# Patient Record
Sex: Female | Born: 1965 | Race: White | Hispanic: No | Marital: Married | Smoking: Former smoker
Health system: Southern US, Community
[De-identification: ages and names within clinical notes are randomized; demographics above are authoritative.]

## PROBLEM LIST (undated history)

## (undated) DIAGNOSIS — E039 Hypothyroidism, unspecified: Secondary | ICD-10-CM

## (undated) DIAGNOSIS — Z5189 Encounter for other specified aftercare: Secondary | ICD-10-CM

## (undated) DIAGNOSIS — N200 Calculus of kidney: Secondary | ICD-10-CM

## (undated) DIAGNOSIS — F419 Anxiety disorder, unspecified: Secondary | ICD-10-CM

## (undated) DIAGNOSIS — D649 Anemia, unspecified: Secondary | ICD-10-CM

## (undated) DIAGNOSIS — E559 Vitamin D deficiency, unspecified: Secondary | ICD-10-CM

## (undated) DIAGNOSIS — Z86010 Personal history of colonic polyps: Principal | ICD-10-CM

## (undated) DIAGNOSIS — F53 Postpartum depression: Secondary | ICD-10-CM

## (undated) DIAGNOSIS — T7840XA Allergy, unspecified, initial encounter: Secondary | ICD-10-CM

## (undated) DIAGNOSIS — O99345 Other mental disorders complicating the puerperium: Secondary | ICD-10-CM

## (undated) DIAGNOSIS — E063 Autoimmune thyroiditis: Secondary | ICD-10-CM

## (undated) DIAGNOSIS — K3184 Gastroparesis: Secondary | ICD-10-CM

## (undated) DIAGNOSIS — M8580 Other specified disorders of bone density and structure, unspecified site: Secondary | ICD-10-CM

## (undated) HISTORY — DX: Hypothyroidism, unspecified: E03.9

## (undated) HISTORY — DX: Postpartum depression: F53.0

## (undated) HISTORY — DX: Other mental disorders complicating the puerperium: O99.345

## (undated) HISTORY — PX: COLONOSCOPY: SHX174

## (undated) HISTORY — DX: Gastroparesis: K31.84

## (undated) HISTORY — DX: Calculus of kidney: N20.0

## (undated) HISTORY — DX: Vitamin D deficiency, unspecified: E55.9

## (undated) HISTORY — DX: Personal history of colonic polyps: Z86.010

## (undated) HISTORY — DX: Allergy, unspecified, initial encounter: T78.40XA

## (undated) HISTORY — DX: Anxiety disorder, unspecified: F41.9

## (undated) HISTORY — DX: Other specified disorders of bone density and structure, unspecified site: M85.80

## (undated) HISTORY — DX: Encounter for other specified aftercare: Z51.89

## (undated) HISTORY — DX: Anemia, unspecified: D64.9

## (undated) HISTORY — DX: Autoimmune thyroiditis: E06.3

---

## 1989-08-30 HISTORY — PX: KIDNEY STONE SURGERY: SHX686

## 2012-10-09 ENCOUNTER — Ambulatory Visit (AMBULATORY_SURGERY_CENTER): Payer: PRIVATE HEALTH INSURANCE | Admitting: *Deleted

## 2012-10-09 VITALS — Ht 65.5 in | Wt 143.4 lb

## 2012-10-09 DIAGNOSIS — Z1211 Encounter for screening for malignant neoplasm of colon: Secondary | ICD-10-CM

## 2012-10-09 DIAGNOSIS — Z800 Family history of malignant neoplasm of digestive organs: Secondary | ICD-10-CM

## 2012-10-09 MED ORDER — NA SULFATE-K SULFATE-MG SULF 17.5-3.13-1.6 GM/177ML PO SOLN: ORAL | Status: DC

## 2012-10-10 ENCOUNTER — Encounter: Payer: Self-pay | Admitting: Internal Medicine

## 2012-10-16 ENCOUNTER — Encounter: Payer: Self-pay | Admitting: Internal Medicine

## 2012-12-06 ENCOUNTER — Encounter: Payer: PRIVATE HEALTH INSURANCE | Admitting: Internal Medicine

## 2012-12-29 ENCOUNTER — Telehealth: Payer: Self-pay | Admitting: *Deleted

## 2012-12-29 ENCOUNTER — Ambulatory Visit (AMBULATORY_SURGERY_CENTER): Payer: PRIVATE HEALTH INSURANCE | Admitting: *Deleted

## 2012-12-29 ENCOUNTER — Encounter: Payer: Self-pay | Admitting: Internal Medicine

## 2012-12-29 VITALS — Ht 65.5 in | Wt 141.8 lb

## 2012-12-29 DIAGNOSIS — Z1211 Encounter for screening for malignant neoplasm of colon: Secondary | ICD-10-CM

## 2012-12-29 NOTE — Telephone Encounter (Signed)
I think so given the family hx.

## 2012-12-29 NOTE — Telephone Encounter (Signed)
Faxed ROI to Dr. Sharrell Ku at fax #[phone].

## 2012-12-29 NOTE — Telephone Encounter (Signed)
Pt notified to proceed with colonoscopy as planned 

## 2012-12-29 NOTE — Telephone Encounter (Signed)
Pt is 47 years old scheduled for direct colonoscopy with Dr. Leone Payor on 5/16.  Last colonoscopy 6 years ago with Dr. Kinnie Scales.  Pt says exam was normal. Release of information form signed and given to Patti Swaziland, CMA.    Dr Leone Payor: pt says that last colon 6 years ago normal.  Family history colon polyps in mother and maternal grandfather had colon cancer at age 51.   Current GI issue: anemia x 1 year being followed by Dr Allena Napoleon.  Is she due for colonoscopy now?

## 2013-01-05 ENCOUNTER — Encounter: Payer: Self-pay | Admitting: Internal Medicine

## 2013-01-05 NOTE — Progress Notes (Signed)
Patient ID: Ann Calhoun, female   DOB: May 04, 1966, 47 y.o.   MRN: [id number] Received records from Dr. Jennye Boroughs office.  Given to Dr. Leone Payor to review prior to her colonscopy .

## 2013-01-08 ENCOUNTER — Encounter: Payer: Self-pay | Admitting: Internal Medicine

## 2013-01-12 ENCOUNTER — Ambulatory Visit (AMBULATORY_SURGERY_CENTER): Payer: PRIVATE HEALTH INSURANCE | Admitting: Internal Medicine

## 2013-01-12 ENCOUNTER — Encounter: Payer: Self-pay | Admitting: Internal Medicine

## 2013-01-12 VITALS — BP 115/58 | HR 88 | Temp 98.4°F | Resp 19 | Ht 65.5 in | Wt 142.0 lb

## 2013-01-12 DIAGNOSIS — Z8371 Family history of colonic polyps: Secondary | ICD-10-CM

## 2013-01-12 DIAGNOSIS — Z1211 Encounter for screening for malignant neoplasm of colon: Secondary | ICD-10-CM

## 2013-01-12 DIAGNOSIS — Z800 Family history of malignant neoplasm of digestive organs: Secondary | ICD-10-CM

## 2013-01-12 MED ORDER — SODIUM CHLORIDE 0.9 % IV SOLN: 500.0000 mL | INTRAVENOUS | Status: DC

## 2013-01-12 NOTE — Op Note (Addendum)
[facility] [address] [place], [address]   COLONOSCOPY PROCEDURE REPORT  PATIENT: Ann Calhoun, Ann Calhoun  MR#: [id number] BIRTHDATE: March 25, 1966 , 47  yrs. old GENDER: Female ENDOSCOPIST: Iva Boop, MD, [facility] PROCEDURE DATE:  01/12/2013 PROCEDURE:   Colonoscopy, screening ASA CLASS:   Class II INDICATIONS:Patient's family history of colon polyps and Patient's family history of colon cancer. MEDICATIONS: Propofol (Diprivan) 230 mg IV, MAC sedation, administered by CRNA, and These medications were titrated to patient response per physician's verbal order  DESCRIPTION OF PROCEDURE:   After the risks benefits and alternatives of the procedure were thoroughly explained, informed consent was obtained.  A digital rectal exam revealed no abnormalities of the rectum.   The LB [id number]  endoscope was introduced through the anus and advanced to the cecum, which was identified by both the appendix and ileocecal valve. No adverse events experienced.   The quality of the prep was excellent using Suprep  The instrument was then slowly withdrawn as the colon was fully examined.      COLON FINDINGS: A normal appearing cecum, ileocecal valve, and appendiceal orifice were identified.  The ascending, hepatic flexure, transverse, splenic flexure, descending, sigmoid colon and rectum appeared unremarkable.  No polyps or cancers were seen.   A right colon retroflexion was performed.  Retroflexed views revealed no abnormalities. The time to cecum=4 minutes 21 seconds. Withdrawal time=8 minutes 36 seconds.  The scope was withdrawn and the procedure completed. COMPLICATIONS: There were no complications.  ENDOSCOPIC IMPRESSION: Normal colonoscopy, excellent prep in patient w/ family hx colon cancer (maternal grandfather) and polyps (mother)  RECOMMENDATIONS: Repeat Colonoscopy in 5 years.   eSigned:  Iva Boop, MD, [facility] 01/12/2013 9:40 AM Revised: 01/12/2013  9:40 AM  cc: Mia Creek, MD and The Patient

## 2013-01-12 NOTE — Patient Instructions (Addendum)
The colonoscopy was normal and the prep was great.  It is reasonable to repeat a routine colonoscopy in about 5 years - 2019.  I appreciate the opportunity to care for you.  Iva Boop, MD, [facility]  Discharge instructions given with verbal understanding. Normal exam. Resume previous medications. YOU HAD AN ENDOSCOPIC PROCEDURE TODAY AT THE [facility]: Refer to the procedure report that was given to you for any specific questions about what was found during the examination.  If the procedure report does not answer your questions, please call your gastroenterologist to clarify.  If you requested that your care partner not be given the details of your procedure findings, then the procedure report has been included in a sealed envelope for you to review at your convenience later.  YOU SHOULD EXPECT: Some feelings of bloating in the abdomen. Passage of more gas than usual.  Walking can help get rid of the air that was put into your GI tract during the procedure and reduce the bloating. If you had a lower endoscopy (such as a colonoscopy or flexible sigmoidoscopy) you may notice spotting of blood in your stool or on the toilet paper. If you underwent a bowel prep for your procedure, then you may not have a normal bowel movement for a few days.  DIET: Your first meal following the procedure should be a light meal and then it is ok to progress to your normal diet.  A half-sandwich or bowl of soup is an example of a good first meal.  Heavy or fried foods are harder to digest and may make you feel nauseous or bloated.  Likewise meals heavy in dairy and vegetables can cause extra gas to form and this can also increase the bloating.  Drink plenty of fluids but you should avoid alcoholic beverages for 24 hours.  ACTIVITY: Your care partner should take you home directly after the procedure.  You should plan to take it easy, moving slowly for the rest of the day.  You can resume normal activity the  day after the procedure however you should NOT DRIVE or use heavy machinery for 24 hours (because of the sedation medicines used during the test).    SYMPTOMS TO REPORT IMMEDIATELY: A gastroenterologist can be reached at any hour.  During normal business hours, 8:30 AM to 5:00 PM Monday through Friday, call [phone].  After hours and on weekends, please call the GI answering service at [phone] who will take a message and have the physician on call contact you.   Following lower endoscopy (colonoscopy or flexible sigmoidoscopy):  Excessive amounts of blood in the stool  Significant tenderness or worsening of abdominal pains  Swelling of the abdomen that is new, acute  Fever of 100F or higher FOLLOW UP: If any biopsies were taken you will be contacted by phone or by letter within the next 1-3 weeks.  Call your gastroenterologist if you have not heard about the biopsies in 3 weeks.  Our staff will call the home number listed on your records the next business day following your procedure to check on you and address any questions or concerns that you may have at that time regarding the information given to you following your procedure. This is a courtesy call and so if there is no answer at the home number and we have not heard from you through the emergency physician on call, we will assume that you have returned to your regular daily activities without incident.  SIGNATURES/CONFIDENTIALITY: You and/or your care partner have signed paperwork which will be entered into your electronic medical record.  These signatures attest to the fact that that the information above on your After Visit Summary has been reviewed and is understood.  Full responsibility of the confidentiality of this discharge information lies with you and/or your care-partner.

## 2013-01-12 NOTE — Progress Notes (Signed)
Patient did not experience any of the following events: a burn prior to discharge; a fall within the facility; wrong site/side/patient/procedure/implant event; or a hospital transfer or hospital admission upon discharge from the facility. (G8907) Patient did not have preoperative order for IV antibiotic SSI prophylaxis. (G8918)  

## 2013-01-15 ENCOUNTER — Telehealth: Payer: Self-pay | Admitting: *Deleted

## 2013-01-15 NOTE — Telephone Encounter (Signed)
  Follow up Call-  Call back number 01/12/2013  Post procedure Call Back phone  # [phone]  Permission to leave phone message Yes     Patient questions:  Do you have a fever, pain , or abdominal swelling? no Pain Score  0 *  Have you tolerated food without any problems? yes  Have you been able to return to your normal activities? yes  Do you have any questions about your discharge instructions: Diet   no Medications  no Follow up visit  no  Do you have questions or concerns about your Care? no  Actions: * If pain score is 4 or above: No action needed, pain <4.  Patient stating she has noticed some dizziness since her procedure.( But, only when driving, patient stating she was driving her daughter to school at the time of this note and felt slight dizziness)  Patient stating she is drinking plenty of water. No other complaints.  Informed the patient of the short half life of Propofol and the dizziness at this point was not related to the medication or the procedure. Discussed with patient that she should follow up with PCP if dizziness continues. Patient agrees with this plan. Note forwarded to Dr. Leone Payor for any other suggestions.

## 2013-08-30 HISTORY — PX: BREAST BIOPSY: SHX20

## 2014-05-03 ENCOUNTER — Other Ambulatory Visit: Payer: Self-pay | Admitting: Obstetrics and Gynecology

## 2014-05-03 DIAGNOSIS — R928 Other abnormal and inconclusive findings on diagnostic imaging of breast: Secondary | ICD-10-CM

## 2014-05-13 ENCOUNTER — Other Ambulatory Visit: Payer: Self-pay | Admitting: Obstetrics and Gynecology

## 2014-05-13 ENCOUNTER — Encounter (INDEPENDENT_AMBULATORY_CARE_PROVIDER_SITE_OTHER): Payer: Self-pay

## 2014-05-13 ENCOUNTER — Ambulatory Visit
Admission: RE | Admit: 2014-05-13 | Discharge: 2014-05-13 | Disposition: A | Discharge status: Home / Self Care | Payer: PRIVATE HEALTH INSURANCE | Source: Ambulatory Visit | Attending: Obstetrics and Gynecology | Admitting: Obstetrics and Gynecology

## 2014-05-13 DIAGNOSIS — R921 Mammographic calcification found on diagnostic imaging of breast: Secondary | ICD-10-CM

## 2014-05-13 DIAGNOSIS — R928 Other abnormal and inconclusive findings on diagnostic imaging of breast: Secondary | ICD-10-CM

## 2014-05-29 ENCOUNTER — Other Ambulatory Visit: Payer: Self-pay | Admitting: Obstetrics and Gynecology

## 2014-05-29 ENCOUNTER — Ambulatory Visit
Admission: RE | Admit: 2014-05-29 | Discharge: 2014-05-29 | Disposition: A | Discharge status: Home / Self Care | Payer: PRIVATE HEALTH INSURANCE | Source: Ambulatory Visit | Attending: Obstetrics and Gynecology | Admitting: Obstetrics and Gynecology

## 2014-05-29 DIAGNOSIS — R921 Mammographic calcification found on diagnostic imaging of breast: Secondary | ICD-10-CM

## 2014-05-30 ENCOUNTER — Ambulatory Visit
Admission: RE | Admit: 2014-05-30 | Discharge: 2014-05-30 | Disposition: A | Discharge status: Home / Self Care | Payer: PRIVATE HEALTH INSURANCE | Source: Ambulatory Visit | Attending: Obstetrics and Gynecology | Admitting: Obstetrics and Gynecology

## 2014-05-30 DIAGNOSIS — R921 Mammographic calcification found on diagnostic imaging of breast: Secondary | ICD-10-CM

## 2014-07-18 ENCOUNTER — Other Ambulatory Visit: Payer: Self-pay

## 2015-04-08 ENCOUNTER — Other Ambulatory Visit: Payer: Self-pay

## 2015-04-08 DIAGNOSIS — Z1231 Encounter for screening mammogram for malignant neoplasm of breast: Secondary | ICD-10-CM

## 2015-05-19 ENCOUNTER — Ambulatory Visit
Admission: RE | Admit: 2015-05-19 | Discharge: 2015-05-19 | Disposition: A | Discharge status: Home / Self Care | Payer: PRIVATE HEALTH INSURANCE | Source: Ambulatory Visit

## 2015-05-19 DIAGNOSIS — Z1231 Encounter for screening mammogram for malignant neoplasm of breast: Secondary | ICD-10-CM

## 2015-05-20 ENCOUNTER — Other Ambulatory Visit: Payer: Self-pay | Admitting: Obstetrics and Gynecology

## 2015-05-20 DIAGNOSIS — R928 Other abnormal and inconclusive findings on diagnostic imaging of breast: Secondary | ICD-10-CM

## 2015-05-21 ENCOUNTER — Ambulatory Visit
Admission: RE | Admit: 2015-05-21 | Discharge: 2015-05-21 | Disposition: A | Discharge status: Home / Self Care | Payer: PRIVATE HEALTH INSURANCE | Source: Ambulatory Visit | Attending: Obstetrics and Gynecology | Admitting: Obstetrics and Gynecology

## 2015-05-21 DIAGNOSIS — R928 Other abnormal and inconclusive findings on diagnostic imaging of breast: Secondary | ICD-10-CM

## 2015-05-29 ENCOUNTER — Other Ambulatory Visit: Payer: Self-pay | Admitting: Obstetrics and Gynecology

## 2015-05-29 DIAGNOSIS — N6489 Other specified disorders of breast: Secondary | ICD-10-CM

## 2015-05-29 DIAGNOSIS — N6320 Unspecified lump in the left breast, unspecified quadrant: Secondary | ICD-10-CM

## 2015-06-10 ENCOUNTER — Ambulatory Visit
Admission: RE | Admit: 2015-06-10 | Discharge: 2015-06-10 | Disposition: A | Discharge status: Home / Self Care | Payer: PRIVATE HEALTH INSURANCE | Source: Ambulatory Visit | Attending: Obstetrics and Gynecology | Admitting: Obstetrics and Gynecology

## 2015-06-10 DIAGNOSIS — N6489 Other specified disorders of breast: Secondary | ICD-10-CM

## 2015-06-10 DIAGNOSIS — N6320 Unspecified lump in the left breast, unspecified quadrant: Secondary | ICD-10-CM

## 2015-06-10 MED ORDER — GADOBENATE DIMEGLUMINE 529 MG/ML IV SOLN
11.0000 mL | Freq: Once | INTRAVENOUS | Status: AC | PRN
  Administered 2015-06-10: 11 mL via INTRAVENOUS

## 2016-01-23 ENCOUNTER — Other Ambulatory Visit: Payer: Self-pay | Admitting: Endocrinology

## 2016-01-23 ENCOUNTER — Other Ambulatory Visit: Payer: Self-pay | Admitting: Urology

## 2016-01-23 DIAGNOSIS — E038 Other specified hypothyroidism: Secondary | ICD-10-CM

## 2016-01-30 ENCOUNTER — Ambulatory Visit
Admission: RE | Admit: 2016-01-30 | Discharge: 2016-01-30 | Disposition: A | Discharge status: Home / Self Care | Payer: PRIVATE HEALTH INSURANCE | Source: Ambulatory Visit | Attending: Endocrinology | Admitting: Endocrinology

## 2016-01-30 DIAGNOSIS — E038 Other specified hypothyroidism: Secondary | ICD-10-CM

## 2016-05-17 ENCOUNTER — Other Ambulatory Visit: Payer: Self-pay | Admitting: Obstetrics and Gynecology

## 2016-05-17 DIAGNOSIS — Z1231 Encounter for screening mammogram for malignant neoplasm of breast: Secondary | ICD-10-CM

## 2016-06-02 ENCOUNTER — Ambulatory Visit
Admission: RE | Admit: 2016-06-02 | Discharge: 2016-06-02 | Disposition: A | Discharge status: Home / Self Care | Payer: PRIVATE HEALTH INSURANCE | Source: Ambulatory Visit | Attending: Obstetrics and Gynecology | Admitting: Obstetrics and Gynecology

## 2016-06-02 DIAGNOSIS — Z1231 Encounter for screening mammogram for malignant neoplasm of breast: Secondary | ICD-10-CM

## 2016-09-20 ENCOUNTER — Other Ambulatory Visit: Payer: Self-pay | Admitting: Endocrinology

## 2016-09-20 DIAGNOSIS — E041 Nontoxic single thyroid nodule: Secondary | ICD-10-CM

## 2017-01-18 ENCOUNTER — Ambulatory Visit
Admission: RE | Admit: 2017-01-18 | Discharge: 2017-01-18 | Disposition: A | Discharge status: Home / Self Care | Payer: PRIVATE HEALTH INSURANCE | Source: Ambulatory Visit | Attending: Endocrinology | Admitting: Endocrinology

## 2017-01-18 DIAGNOSIS — E041 Nontoxic single thyroid nodule: Secondary | ICD-10-CM

## 2017-04-05 ENCOUNTER — Other Ambulatory Visit: Payer: Self-pay | Admitting: Obstetrics and Gynecology

## 2017-04-05 DIAGNOSIS — Z1231 Encounter for screening mammogram for malignant neoplasm of breast: Secondary | ICD-10-CM

## 2017-06-06 ENCOUNTER — Ambulatory Visit
Admission: RE | Admit: 2017-06-06 | Discharge: 2017-06-06 | Disposition: A | Discharge status: Home / Self Care | Payer: PRIVATE HEALTH INSURANCE | Source: Ambulatory Visit | Attending: Obstetrics and Gynecology | Admitting: Obstetrics and Gynecology

## 2017-06-06 DIAGNOSIS — Z1231 Encounter for screening mammogram for malignant neoplasm of breast: Secondary | ICD-10-CM

## 2017-12-22 ENCOUNTER — Encounter: Payer: Self-pay | Admitting: Internal Medicine

## 2018-04-25 ENCOUNTER — Other Ambulatory Visit: Payer: Self-pay | Admitting: Obstetrics and Gynecology

## 2018-04-25 DIAGNOSIS — Z1231 Encounter for screening mammogram for malignant neoplasm of breast: Secondary | ICD-10-CM

## 2018-06-06 ENCOUNTER — Ambulatory Visit
Admission: RE | Admit: 2018-06-06 | Discharge: 2018-06-06 | Disposition: A | Discharge status: Home / Self Care | Payer: PRIVATE HEALTH INSURANCE | Source: Ambulatory Visit | Attending: Obstetrics and Gynecology | Admitting: Obstetrics and Gynecology

## 2018-06-06 ENCOUNTER — Ambulatory Visit: Payer: PRIVATE HEALTH INSURANCE

## 2018-06-06 DIAGNOSIS — Z1231 Encounter for screening mammogram for malignant neoplasm of breast: Secondary | ICD-10-CM

## 2018-06-09 ENCOUNTER — Ambulatory Visit
Admission: RE | Admit: 2018-06-09 | Discharge: 2018-06-09 | Disposition: A | Discharge status: Home / Self Care | Payer: PRIVATE HEALTH INSURANCE | Source: Ambulatory Visit | Attending: Obstetrics and Gynecology | Admitting: Obstetrics and Gynecology

## 2018-06-13 ENCOUNTER — Other Ambulatory Visit: Payer: Self-pay | Admitting: Obstetrics and Gynecology

## 2018-06-13 DIAGNOSIS — R928 Other abnormal and inconclusive findings on diagnostic imaging of breast: Secondary | ICD-10-CM

## 2018-06-16 ENCOUNTER — Ambulatory Visit
Admission: RE | Admit: 2018-06-16 | Discharge: 2018-06-16 | Disposition: A | Discharge status: Home / Self Care | Payer: PRIVATE HEALTH INSURANCE | Source: Ambulatory Visit | Attending: Obstetrics and Gynecology | Admitting: Obstetrics and Gynecology

## 2018-06-16 ENCOUNTER — Other Ambulatory Visit: Payer: Self-pay | Admitting: Obstetrics and Gynecology

## 2018-06-16 DIAGNOSIS — N6489 Other specified disorders of breast: Secondary | ICD-10-CM

## 2018-06-16 DIAGNOSIS — R928 Other abnormal and inconclusive findings on diagnostic imaging of breast: Secondary | ICD-10-CM

## 2018-06-28 ENCOUNTER — Ambulatory Visit: Payer: PRIVATE HEALTH INSURANCE

## 2018-10-23 ENCOUNTER — Telehealth: Payer: Self-pay | Admitting: Internal Medicine

## 2018-10-23 NOTE — Telephone Encounter (Signed)
Patient had recent rectal bleeding x 1 - is a few months past due for routine colonoscopy I was contacted by her husband Dr. Matilde Sprang. He says no visible external lesions.  I think we could go ahead with a direct colonoscopy next available that meets her and my schedules.  If she has any other ? Or wants to be seen in office may use a blocked spot this week

## 2018-10-23 NOTE — Telephone Encounter (Signed)
Patient schedudled for pre-visit and colonoscopy on 10/26/18 and 11/09/18

## 2018-10-26 ENCOUNTER — Encounter: Payer: Self-pay | Admitting: Internal Medicine

## 2018-10-26 ENCOUNTER — Ambulatory Visit (AMBULATORY_SURGERY_CENTER): Payer: Self-pay

## 2018-10-26 VITALS — Ht 66.0 in | Wt 123.0 lb

## 2018-10-26 DIAGNOSIS — K625 Hemorrhage of anus and rectum: Secondary | ICD-10-CM

## 2018-10-26 NOTE — Progress Notes (Signed)
Denies allergies to eggs or soy products. Denies complication of anesthesia or sedation. Denies use of weight loss medication. Denies use of O2.   Emmi instructions declined.  

## 2018-11-09 ENCOUNTER — Other Ambulatory Visit: Payer: Self-pay

## 2018-11-09 ENCOUNTER — Ambulatory Visit (AMBULATORY_SURGERY_CENTER): Payer: PRIVATE HEALTH INSURANCE | Admitting: Internal Medicine

## 2018-11-09 ENCOUNTER — Encounter: Payer: Self-pay | Admitting: Internal Medicine

## 2018-11-09 VITALS — BP 115/59 | HR 61 | Temp 99.1°F | Resp 8 | Ht 66.0 in | Wt 123.0 lb

## 2018-11-09 DIAGNOSIS — D120 Benign neoplasm of cecum: Secondary | ICD-10-CM | POA: Diagnosis not present

## 2018-11-09 DIAGNOSIS — K625 Hemorrhage of anus and rectum: Secondary | ICD-10-CM

## 2018-11-09 MED ORDER — SODIUM CHLORIDE 0.9 % IV SOLN: 500.0000 mL | Freq: Once | INTRAVENOUS | Status: DC

## 2018-11-09 NOTE — Progress Notes (Signed)
Pt's states no medical or surgical changes since previsit or office visit. 

## 2018-11-09 NOTE — Progress Notes (Signed)
Called to room to assist during endoscopic procedure.  Patient ID and intended procedure confirmed with present staff. Received instructions for my participation in the procedure from the performing physician.  

## 2018-11-09 NOTE — Patient Instructions (Addendum)
I found and removed one small polyp.  I did not see anything that would have bled but not hard to irritate our hemorrhoids and have that happen.  I will let you know pathology results and when to have another routine colonoscopy by mail and/or My Chart.  I appreciate the opportunity to care for you. Gatha Mayer, MD, FACG   YOU HAD AN ENDOSCOPIC PROCEDURE TODAY AT [facility]:   Refer to the procedure report that was given to you for any specific questions about what was found during the examination.  If the procedure report does not answer your questions, please call your gastroenterologist to clarify.  If you requested that your care partner not be given the details of your procedure findings, then the procedure report has been included in a sealed envelope for you to review at your convenience later.  YOU SHOULD EXPECT: Some feelings of bloating in the abdomen. Passage of more gas than usual.  Walking can help get rid of the air that was put into your GI tract during the procedure and reduce the bloating. If you had a lower endoscopy (such as a colonoscopy or flexible sigmoidoscopy) you may notice spotting of blood in your stool or on the toilet paper. If you underwent a bowel prep for your procedure, you may not have a normal bowel movement for a few days.  Please Note:  You might notice some irritation and congestion in your nose or some drainage.  This is from the oxygen used during your procedure.  There is no need for concern and it should clear up in a day or so.  SYMPTOMS TO REPORT IMMEDIATELY:   Following lower endoscopy (colonoscopy or flexible sigmoidoscopy):  Excessive amounts of blood in the stool  Significant tenderness or worsening of abdominal pains  Swelling of the abdomen that is new, acute  Fever of 100F or higher   For urgent or emergent issues, a gastroenterologist can be reached at any hour by calling [phone].   DIET:  We do  recommend a small meal at first, but then you may proceed to your regular diet.  Drink plenty of fluids but you should avoid alcoholic beverages for 24 hours.  ACTIVITY:  You should plan to take it easy for the rest of today and you should NOT DRIVE or use heavy machinery until tomorrow (because of the sedation medicines used during the test).    FOLLOW UP: Our staff will call the number listed on your records the next business day following your procedure to check on you and address any questions or concerns that you may have regarding the information given to you following your procedure. If we do not reach you, we will leave a message.  However, if you are feeling well and you are not experiencing any problems, there is no need to return our call.  We will assume that you have returned to your regular daily activities without incident.  If any biopsies were taken you will be contacted by phone or by letter within the next 1-3 weeks.  Please call us at [phone] if you have not heard about the biopsies in 3 weeks.    SIGNATURES/CONFIDENTIALITY: You and/or your care partner have signed paperwork which will be entered into your electronic medical record.  These signatures attest to the fact that that the information above on your After Visit Summary has been reviewed and is understood.  Full responsibility of the confidentiality of  this discharge information lies with you and/or your care-partner.

## 2018-11-09 NOTE — Progress Notes (Signed)
A and O x3. Report to RN. Tolerated MAC anesthesia well.

## 2018-11-09 NOTE — Op Note (Signed)
[facility] Patient Name: Ann Calhoun Procedure Date: 11/09/2018 12:06 PM MRN: [id number] Endoscopist: Gatha Mayer , MD Age: 53 Referring MD:  Date of Birth: [birth year] Gender: Female Account #: [id number] Procedure:                Colonoscopy Indications:              Rectal bleeding Medicines:                Propofol per Anesthesia, Monitored Anesthesia Care Procedure:                Pre-Anesthesia Assessment:                           - Prior to the procedure, a History and Physical                            was performed, and patient medications and                            allergies were reviewed. The patient's tolerance of                            previous anesthesia was also reviewed. The risks                            and benefits of the procedure and the sedation                            options and risks were discussed with the patient.                            All questions were answered, and informed consent                            was obtained. Prior Anticoagulants: The patient has                            taken no previous anticoagulant or antiplatelet                            agents. ASA Grade Assessment: II - A patient with                            mild systemic disease. After reviewing the risks                            and benefits, the patient was deemed in                            satisfactory condition to undergo the procedure.                           After obtaining informed consent, the colonoscope  was passed under direct vision. Throughout the                            procedure, the patient's blood pressure, pulse, and                            oxygen saturations were monitored continuously. The                            Model [id number]-[id number]) scope was introduced                            through the anus and advanced to the the cecum,                            identified by  appendiceal orifice and ileocecal                            valve. The colonoscopy was performed without                            difficulty. The patient tolerated the procedure                            well. The quality of the bowel preparation was                            excellent. The ileocecal valve, appendiceal                            orifice, and rectum were photographed. The bowel                            preparation used was Miralax. Scope In: 12:16:50 PM Scope Out: 12:39:41 PM Scope Withdrawal Time: 0 hours 17 minutes 18 seconds  Total Procedure Duration: 0 hours 22 minutes 51 seconds  Findings:                 The perianal and digital rectal examinations were                            normal.                           A 8 mm polyp was found in the cecum. The polyp was                            flat and had a mucous cap.. The polyp was removed                            with a piecemeal technique using a cold stioff                            snare. Resection and retrieval were complete.  Verification of patient identification for the                            specimen was done. Estimated blood loss was minimal.                           The exam was otherwise without abnormality on                            direct and retroflexion views. Complications:            No immediate complications. Estimated Blood Loss:     Estimated blood loss was minimal. Impression:               - One 8 mm polyp in the cecum, removed piecemeal                            using a cold stiff snare. Resected and retrieved.                           - The examination was otherwise normal on direct                            and retroflexion views. Recommendation:           - Patient has a contact number available for                            emergencies. The signs and symptoms of potential                            delayed complications were discussed with  the                            patient. Return to normal activities tomorrow.                            Written discharge instructions were provided to the                            patient.                           - Resume previous diet.                           - Continue present medications.                           - Repeat colonoscopy is recommended. The                            colonoscopy date will be determined after pathology                            results from today's exam become available for  review. Gatha Mayer, MD 11/09/2018 12:46:31 PM This report has been signed electronically.

## 2018-11-10 ENCOUNTER — Telehealth: Payer: Self-pay

## 2018-11-10 ENCOUNTER — Telehealth: Payer: Self-pay | Admitting: *Deleted

## 2018-11-10 NOTE — Telephone Encounter (Signed)
Second follow up call attempt.  Message left to call with any questions or concerns.

## 2018-11-10 NOTE — Telephone Encounter (Signed)
Left message on f/u call 

## 2018-11-16 ENCOUNTER — Encounter: Payer: Self-pay | Admitting: Internal Medicine

## 2018-11-16 DIAGNOSIS — Z860100 Personal history of colon polyps, unspecified: Secondary | ICD-10-CM

## 2018-11-16 HISTORY — DX: Personal history of colon polyps, unspecified: Z86.0100

## 2018-11-16 HISTORY — DX: Personal history of colonic polyps: Z86.010

## 2018-11-16 NOTE — Progress Notes (Signed)
8 mm ssp Recall 2025

## 2018-12-20 ENCOUNTER — Other Ambulatory Visit: Payer: PRIVATE HEALTH INSURANCE

## 2018-12-28 ENCOUNTER — Other Ambulatory Visit: Payer: PRIVATE HEALTH INSURANCE

## 2019-01-08 ENCOUNTER — Other Ambulatory Visit: Payer: PRIVATE HEALTH INSURANCE

## 2019-01-19 ENCOUNTER — Other Ambulatory Visit: Payer: PRIVATE HEALTH INSURANCE

## 2019-01-26 ENCOUNTER — Other Ambulatory Visit: Payer: Self-pay

## 2019-01-26 ENCOUNTER — Ambulatory Visit
Admission: RE | Admit: 2019-01-26 | Discharge: 2019-01-26 | Disposition: A | Discharge status: Home / Self Care | Payer: PRIVATE HEALTH INSURANCE | Source: Ambulatory Visit | Attending: Obstetrics and Gynecology | Admitting: Obstetrics and Gynecology

## 2019-01-26 ENCOUNTER — Other Ambulatory Visit: Payer: Self-pay | Admitting: Obstetrics and Gynecology

## 2019-01-26 DIAGNOSIS — N6489 Other specified disorders of breast: Secondary | ICD-10-CM

## 2019-01-31 ENCOUNTER — Ambulatory Visit
Admission: RE | Admit: 2019-01-31 | Discharge: 2019-01-31 | Disposition: A | Discharge status: Home / Self Care | Payer: PRIVATE HEALTH INSURANCE | Source: Ambulatory Visit | Attending: Obstetrics and Gynecology | Admitting: Obstetrics and Gynecology

## 2019-01-31 ENCOUNTER — Other Ambulatory Visit: Payer: Self-pay

## 2019-01-31 DIAGNOSIS — N6489 Other specified disorders of breast: Secondary | ICD-10-CM

## 2019-02-01 HISTORY — PX: BREAST BIOPSY: SHX20

## 2019-05-03 ENCOUNTER — Other Ambulatory Visit: Payer: Self-pay | Admitting: Obstetrics and Gynecology

## 2019-05-03 DIAGNOSIS — Z1231 Encounter for screening mammogram for malignant neoplasm of breast: Secondary | ICD-10-CM

## 2019-05-09 ENCOUNTER — Other Ambulatory Visit: Payer: Self-pay | Admitting: Radiology

## 2019-05-09 DIAGNOSIS — N6019 Diffuse cystic mastopathy of unspecified breast: Secondary | ICD-10-CM

## 2019-06-04 ENCOUNTER — Other Ambulatory Visit: Payer: PRIVATE HEALTH INSURANCE

## 2019-06-19 ENCOUNTER — Other Ambulatory Visit: Payer: PRIVATE HEALTH INSURANCE

## 2019-07-13 ENCOUNTER — Other Ambulatory Visit: Payer: PRIVATE HEALTH INSURANCE

## 2019-07-16 ENCOUNTER — Other Ambulatory Visit: Payer: Self-pay

## 2019-07-16 ENCOUNTER — Ambulatory Visit
Admission: RE | Admit: 2019-07-16 | Discharge: 2019-07-16 | Disposition: A | Discharge status: Home / Self Care | Payer: Self-pay | Source: Ambulatory Visit | Attending: Radiology | Admitting: Radiology

## 2019-07-16 DIAGNOSIS — N6019 Diffuse cystic mastopathy of unspecified breast: Secondary | ICD-10-CM

## 2019-07-16 MED ORDER — GADOBUTROL 1 MMOL/ML IV SOLN
5.0000 mL | Freq: Once | INTRAVENOUS | Status: AC | PRN
  Administered 2019-07-16: 5 mL via INTRAVENOUS

## 2019-08-02 ENCOUNTER — Other Ambulatory Visit: Payer: Self-pay | Admitting: Obstetrics and Gynecology

## 2019-08-02 DIAGNOSIS — N6489 Other specified disorders of breast: Secondary | ICD-10-CM

## 2019-08-13 ENCOUNTER — Other Ambulatory Visit: Payer: Self-pay

## 2019-08-13 ENCOUNTER — Ambulatory Visit
Admission: RE | Admit: 2019-08-13 | Discharge: 2019-08-13 | Disposition: A | Discharge status: Home / Self Care | Payer: PRIVATE HEALTH INSURANCE | Source: Ambulatory Visit | Attending: Obstetrics and Gynecology | Admitting: Obstetrics and Gynecology

## 2019-08-13 DIAGNOSIS — N6489 Other specified disorders of breast: Secondary | ICD-10-CM

## 2019-08-22 ENCOUNTER — Other Ambulatory Visit: Payer: Self-pay | Admitting: Endocrinology

## 2019-08-22 DIAGNOSIS — E041 Nontoxic single thyroid nodule: Secondary | ICD-10-CM

## 2019-09-06 ENCOUNTER — Ambulatory Visit
Admission: RE | Admit: 2019-09-06 | Discharge: 2019-09-06 | Disposition: A | Discharge status: Home / Self Care | Payer: PRIVATE HEALTH INSURANCE | Source: Ambulatory Visit | Attending: Endocrinology | Admitting: Endocrinology

## 2019-09-06 DIAGNOSIS — E041 Nontoxic single thyroid nodule: Secondary | ICD-10-CM

## 2019-09-26 ENCOUNTER — Other Ambulatory Visit: Payer: Self-pay | Admitting: Endocrinology

## 2019-09-26 DIAGNOSIS — E041 Nontoxic single thyroid nodule: Secondary | ICD-10-CM

## 2019-10-25 ENCOUNTER — Telehealth: Payer: Self-pay | Admitting: Neurology

## 2019-10-25 ENCOUNTER — Other Ambulatory Visit: Payer: Self-pay

## 2019-10-25 ENCOUNTER — Ambulatory Visit (INDEPENDENT_AMBULATORY_CARE_PROVIDER_SITE_OTHER): Payer: PRIVATE HEALTH INSURANCE | Admitting: Neurology

## 2019-10-25 ENCOUNTER — Encounter: Payer: Self-pay | Admitting: Neurology

## 2019-10-25 DIAGNOSIS — Z0289 Encounter for other administrative examinations: Secondary | ICD-10-CM

## 2019-10-25 DIAGNOSIS — R200 Anesthesia of skin: Secondary | ICD-10-CM | POA: Diagnosis not present

## 2019-10-25 DIAGNOSIS — R4701 Aphasia: Secondary | ICD-10-CM

## 2019-10-25 DIAGNOSIS — R29898 Other symptoms and signs involving the musculoskeletal system: Secondary | ICD-10-CM | POA: Diagnosis not present

## 2019-10-25 DIAGNOSIS — R202 Paresthesia of skin: Secondary | ICD-10-CM | POA: Diagnosis not present

## 2019-10-25 DIAGNOSIS — R253 Fasciculation: Secondary | ICD-10-CM | POA: Diagnosis not present

## 2019-10-25 NOTE — Progress Notes (Signed)
See procedure note.

## 2019-10-25 NOTE — Progress Notes (Signed)
Full Name: Ann Calhoun Gender: Female MRN #: [id number] Date of Birth: 1966/05/20    Visit Date: 10/25/2019 09:00 Age: 54 Years Examining Physician: Sarina Ill, MD  Referring Physician: Roseanne Kaufman, MD, Marton Redwood, MD  History: Patient with paresthesias  Summary: EMG/NCS was performed on the bilateral upper extremities and right lower extremity. All nerves and muscles (as indicated in the following tables) were within normal limits.     Conclusion: This is a normal study. No evidence for mononeuropathy, polyneuropathy, radiculopathy or muscle disorder.   Sarina Ill M.D.  [facility] [place], [place] [address] Tel: [phone] Fax: [phone]         [facility]    Nerve / Sites Muscle Latency Ref. Amplitude Ref. Rel Amp Segments Distance Velocity Ref. Area    ms ms mV mV %  cm m/s m/s mVms  R Median - APB     Wrist APB 2.8 ?4.4 10.7 ?4.0 100 Wrist - APB 7   39.2     Upper arm APB 6.4  10.7  100 Upper arm - Wrist 22 61 ?49 38.4  L Median - APB     Wrist APB 2.8 ?4.4 8.0 ?4.0 100 Wrist - APB 7   25.5     Upper arm APB 6.5  7.8  98.2 Upper arm - Wrist 22 60 ?49 25.5  R Ulnar - ADM     Wrist ADM 2.4 ?3.3 13.6 ?6.0 100 Wrist - ADM 7   36.1     B.Elbow ADM 5.6  13.6  100 B.Elbow - Wrist 19 59 ?49 32.7     A.Elbow ADM 7.4  12.9  95.1 A.Elbow - B.Elbow 10 56 ?49 32.5         A.Elbow - Wrist      L Ulnar - ADM     Wrist ADM 2.5 ?3.3 11.5 ?6.0 100 Wrist - ADM 7   27.3     B.Elbow ADM 5.5  10.3  89.4 B.Elbow - Wrist 19 63 ?49 24.0     A.Elbow ADM 7.2  9.5  92.4 A.Elbow - B.Elbow 10 60 ?49 24.0         A.Elbow - Wrist      R Peroneal - EDB     Ankle EDB 4.1 ?6.5 5.8 ?2.0 100 Ankle - EDB 9   18.5     Fib head EDB 9.8  5.1  88.7 Fib head - Ankle 29 52 ?44 18.3     Pop fossa EDB 11.9  4.9  96.3 Pop fossa - Fib head 10 46 ?44 18.2         Pop fossa - Ankle      R Tibial - AH     Ankle AH 3.3 ?5.8 6.5 ?4.0 100 Ankle - AH 9   16.4   Pop fossa AH 11.9  5.2  80.5 Pop fossa - Ankle 38 44 ?41 18.0                    SNC    Nerve / Sites Rec. Site Peak Lat Ref.  Amp Ref. Segments Distance Peak Diff Ref.    ms ms V V  cm ms ms  R Radial - Anatomical snuff box (Forearm)     Forearm Wrist 2.4 ?2.9 35 ?15 Forearm - Wrist 10    L Radial - Anatomical snuff box (Forearm)     Forearm Wrist 2.4 ?2.9 36 ?  15 Forearm - Wrist 10    R Sural - Ankle (Calf)     Calf Ankle 3.5 ?4.4 23 ?6 Calf - Ankle 14    R Superficial peroneal - Ankle     Lat leg Ankle 3.7 ?4.4 12 ?6 Lat leg - Ankle 14    R Median, Ulnar - Transcarpal comparison     Median Palm Wrist 1.9 ?2.2 144 ?35 Median Palm - Wrist 8       Ulnar Palm Wrist 1.9 ?2.2 45 ?12 Ulnar Palm - Wrist 8          Median Palm - Ulnar Palm  0.0 ?0.4  L Median, Ulnar - Transcarpal comparison     Median Palm Wrist 1.9 ?2.2 218 ?35 Median Palm - Wrist 8       Ulnar Palm Wrist 2.0 ?2.2 62 ?12 Ulnar Palm - Wrist 8          Median Palm - Ulnar Palm  -0.1 ?0.4  R Median - Orthodromic (Dig II, Mid palm)     Dig II Wrist 3.0 ?3.4 15 ?10 Dig II - Wrist 13    L Median - Orthodromic (Dig II, Mid palm)     Dig II Wrist 2.9 ?3.4 16 ?10 Dig II - Wrist 13    R Ulnar - Orthodromic, (Dig V, Mid palm)     Dig V Wrist 2.8 ?3.1 10 ?5 Dig V - Wrist 11    L Ulnar - Orthodromic, (Dig V, Mid palm)     Dig V Wrist 2.6 ?3.1 12 ?5 Dig V - Wrist 84                           F  Wave    Nerve F Lat Ref.   ms ms  R Ulnar - ADM 25.8 ?32.0  L Ulnar - ADM 27.4 ?32.0  R Tibial - AH 48.4 ?56.0           EMG Summary Table    Spontaneous MUAP Recruitment  Muscle IA Fib PSW Fasc Other Amp Dur. Poly Pattern  R. Cervical paraspinals (low) Normal None None None _______ Normal Normal Normal Normal  R. Deltoid Normal None None None _______ Normal Normal Normal Normal  R. Triceps brachii Normal None None None _______ Normal Normal Normal Normal  R. First dorsal interosseous Normal None None None _______ Normal Normal  Normal Normal  R. Abductor digiti minimi (manus) Normal None None None _______ Normal Normal Normal Normal  R. Flexor digitorum profundus (Median) Normal None None None _______ Normal Normal Normal Normal  R. Pronator teres Normal None None None _______ Normal Normal Normal Normal  R. Vastus medialis Normal None None None _______ Normal Normal Normal Normal  R. Tibialis anterior Normal None None None _______ Normal Normal Normal Normal  R. Gastrocnemius (Medial head) Normal None None None _______ Normal Normal Normal Normal  R. Extensor hallucis longus Normal None None None _______ Normal Normal Normal Normal  R. Abductor hallucis Normal None None None _______ Normal Normal Normal Normal  R. Lumbar paraspinals (low) Normal None None None _______ Normal Normal Normal Normal  R. Gluteus maximus Normal None None None _______ Normal Normal Normal Normal  R. Gluteus medius Normal None None None _______ Normal Normal Normal Normal  R. Biceps femoris (long head) Normal None None None _______ Normal Normal Normal Normal

## 2019-10-25 NOTE — Procedures (Signed)
Full Name: Ann Calhoun Gender: Female MRN #: [id number] Date of Birth: October 05, 1965    Visit Date: 10/25/2019 09:00 Age: 54 Years Examining Physician: Sarina Ill, MD  Referring Physician: Roseanne Kaufman, MD, Marton Redwood, MD  History: Patient with paresthesias  Summary: EMG/NCS was performed on the bilateral upper extremities and right lower extremity. All nerves and muscles (as indicated in the following tables) were within normal limits.     Conclusion: This is a normal study. No evidence for mononeuropathy, polyneuropathy, radiculopathy or muscle disorder.   Sarina Ill M.D.  [facility] [place], [place] [address] Tel: [phone] Fax: [phone]         [facility]    Nerve / Sites Muscle Latency Ref. Amplitude Ref. Rel Amp Segments Distance Velocity Ref. Area    ms ms mV mV %  cm m/s m/s mVms  R Median - APB     Wrist APB 2.8 ?4.4 10.7 ?4.0 100 Wrist - APB 7   39.2     Upper arm APB 6.4  10.7  100 Upper arm - Wrist 22 61 ?49 38.4  L Median - APB     Wrist APB 2.8 ?4.4 8.0 ?4.0 100 Wrist - APB 7   25.5     Upper arm APB 6.5  7.8  98.2 Upper arm - Wrist 22 60 ?49 25.5  R Ulnar - ADM     Wrist ADM 2.4 ?3.3 13.6 ?6.0 100 Wrist - ADM 7   36.1     B.Elbow ADM 5.6  13.6  100 B.Elbow - Wrist 19 59 ?49 32.7     A.Elbow ADM 7.4  12.9  95.1 A.Elbow - B.Elbow 10 56 ?49 32.5         A.Elbow - Wrist      L Ulnar - ADM     Wrist ADM 2.5 ?3.3 11.5 ?6.0 100 Wrist - ADM 7   27.3     B.Elbow ADM 5.5  10.3  89.4 B.Elbow - Wrist 19 63 ?49 24.0     A.Elbow ADM 7.2  9.5  92.4 A.Elbow - B.Elbow 10 60 ?49 24.0         A.Elbow - Wrist      R Peroneal - EDB     Ankle EDB 4.1 ?6.5 5.8 ?2.0 100 Ankle - EDB 9   18.5     Fib head EDB 9.8  5.1  88.7 Fib head - Ankle 29 52 ?44 18.3     Pop fossa EDB 11.9  4.9  96.3 Pop fossa - Fib head 10 46 ?44 18.2         Pop fossa - Ankle      R Tibial - AH     Ankle AH 3.3 ?5.8 6.5 ?4.0 100 Ankle - AH 9   16.4   Pop fossa AH 11.9  5.2  80.5 Pop fossa - Ankle 38 44 ?41 18.0                    SNC    Nerve / Sites Rec. Site Peak Lat Ref.  Amp Ref. Segments Distance Peak Diff Ref.    ms ms V V  cm ms ms  R Radial - Anatomical snuff box (Forearm)     Forearm Wrist 2.4 ?2.9 35 ?15 Forearm - Wrist 10    L Radial - Anatomical snuff box (Forearm)     Forearm Wrist 2.4 ?2.9 36 ?  15 Forearm - Wrist 10    R Sural - Ankle (Calf)     Calf Ankle 3.5 ?4.4 23 ?6 Calf - Ankle 14    R Superficial peroneal - Ankle     Lat leg Ankle 3.7 ?4.4 12 ?6 Lat leg - Ankle 14    R Median, Ulnar - Transcarpal comparison     Median Palm Wrist 1.9 ?2.2 144 ?35 Median Palm - Wrist 8       Ulnar Palm Wrist 1.9 ?2.2 45 ?12 Ulnar Palm - Wrist 8          Median Palm - Ulnar Palm  0.0 ?0.4  L Median, Ulnar - Transcarpal comparison     Median Palm Wrist 1.9 ?2.2 218 ?35 Median Palm - Wrist 8       Ulnar Palm Wrist 2.0 ?2.2 62 ?12 Ulnar Palm - Wrist 8          Median Palm - Ulnar Palm  -0.1 ?0.4  R Median - Orthodromic (Dig II, Mid palm)     Dig II Wrist 3.0 ?3.4 15 ?10 Dig II - Wrist 13    L Median - Orthodromic (Dig II, Mid palm)     Dig II Wrist 2.9 ?3.4 16 ?10 Dig II - Wrist 13    R Ulnar - Orthodromic, (Dig V, Mid palm)     Dig V Wrist 2.8 ?3.1 10 ?5 Dig V - Wrist 11    L Ulnar - Orthodromic, (Dig V, Mid palm)     Dig V Wrist 2.6 ?3.1 12 ?5 Dig V - Wrist 12                           F  Wave    Nerve F Lat Ref.   ms ms  R Ulnar - ADM 25.8 ?32.0  L Ulnar - ADM 27.4 ?32.0  R Tibial - AH 48.4 ?56.0           EMG Summary Table    Spontaneous MUAP Recruitment  Muscle IA Fib PSW Fasc Other Amp Dur. Poly Pattern  R. Cervical paraspinals (low) Normal None None None _______ Normal Normal Normal Normal  R. Deltoid Normal None None None _______ Normal Normal Normal Normal  R. Triceps brachii Normal None None None _______ Normal Normal Normal Normal  R. First dorsal interosseous Normal None None None _______ Normal Normal  Normal Normal  R. Abductor digiti minimi (manus) Normal None None None _______ Normal Normal Normal Normal  R. Flexor digitorum profundus (Median) Normal None None None _______ Normal Normal Normal Normal  R. Pronator teres Normal None None None _______ Normal Normal Normal Normal  R. Vastus medialis Normal None None None _______ Normal Normal Normal Normal  R. Tibialis anterior Normal None None None _______ Normal Normal Normal Normal  R. Gastrocnemius (Medial head) Normal None None None _______ Normal Normal Normal Normal  R. Extensor hallucis longus Normal None None None _______ Normal Normal Normal Normal  R. Abductor hallucis Normal None None None _______ Normal Normal Normal Normal  R. Lumbar paraspinals (low) Normal None None None _______ Normal Normal Normal Normal  R. Gluteus maximus Normal None None None _______ Normal Normal Normal Normal  R. Gluteus medius Normal None None None _______ Normal Normal Normal Normal  R. Biceps femoris (long head) Normal None None None _______ Normal Normal Normal Normal

## 2019-10-25 NOTE — Telephone Encounter (Signed)
Ann Calhoun can you please get a copy of the MRI cervical spine results from [facility] please? Thank you !

## 2019-10-25 NOTE — Progress Notes (Addendum)
[facility]    Provider:  Dr Ahern Requesting Provider: Dr. Gramig and Dr. William Shaw Primary Care Provider:  Shaw, William, MD  CC: Paresthesias  HPI:  Ann Calhoun is a 54 y.o. female here as requested by Dr. Gramig for cubital tunnel syndrome and Dr. Williams Shaw for evaluation of paresthesias. PMHx elevated ferritin, Hashimoto's hypothyroidism, osteopenia, parasthesias   I had a discussion with Dr. William Shaw on the phone yesterday regarding patient, patient is having paresthesias in her limbs and also in the face, she feels after she wears a mask she has numbness in the face and numbness on the tip of her tongue, she also feels as though if she touches anywhere it feels numb. Per Dr. Shaw, she is pending results of MRI cervical spine ordered by Dr. Gramig (I don't see that in Epic must have been completed at [facility]). I reviewed Dr. Shaw's notes, patient reported paresthesias in the feet and arms with quivering lip, twitching in the upper lip, labs from Dr. Ballan were normal, twitching resolved 2/9. While sleeping noted arms more stiff, she also saw Dr. Gramig who wanted to evaluate for cubital tunnel syndrome and have nerve conduction studies. Episodes of fingers feeling tight, correlates with how she is laying, trouble bending toes forward, felt thick like edema but no swelling noted, more recently feeling like knees are stiff and calf tightness. Has Hashimoto's but not on treatment, started an autoimmune diet, chronically elevated ferritin 214, exercise limited. I reviewed Dr. Shaw's examination which was normal including physical exam and neurologic exam.   At th end of January she started getting twitches in the whole upper lip, brief twitches,  she thought it was her potassium, she also started feeling like she had edema in the feet but no visible edema at least recently, she changed to other manufacturer for potassium. She saw Dr.  Ballan(Endocrinologist) and potassium was fine, the twitching went away but a few days later she was sleeping on her back and woke up and digits 4-5 felt a little numb and she could barely move the fingers, it comes and goes, she states "Wartenburg's sign" and she saw Dr. Gramig and did not think it was ulnar entrapment. The next week she noted decreased adduction of her digit 5. Then her knees felt like they were swollen and tight but did not visually see any swelling also couldn't bend her toes, bilateral symmetric.  She started an autoimmune diet and after one day she felt better. She noticed numbness in her lips bilaterally and tip of tongue after wearing her mask all day and then noticed worsening numbness in her face bilaterally after wearing a mask. She hs also noticed pressure on her low back felt strange and numbness in her back. Symptoms above are random and come and go. Everything is symmetric except the right foot still feels like she can't move her toes as well. Grandmother had Lupus. She denies any radicular symptoms, she sometimes jas problems getting her words out.   Reviewed notes, labs and imaging from outside physicians, which showed:   crp 1, sed 30. Have requested labs from Dr. Shaw's office for the past year.    Review of Systems: Patient complains of symptoms per HPI as well as the following symptoms: paresthesias Pertinent negatives and positives per HPI. All others negative.   Social History   Socioeconomic History  . Marital status: Married    Spouse name: Not on file  . Number of children:   Not on file  . Years of education: Not on file  . Highest education level: Not on file  Occupational History  . Not on file  Tobacco Use  . Smoking status: Former Smoker    Quit date: 12/30/1995    Years since quitting: 23.8  . Smokeless tobacco: Never Used  . Tobacco comment: occasional at that time  Substance and Sexual Activity  . Alcohol use: Yes    Alcohol/week: 4.0  standard drinks    Types: 4 Glasses of wine per week  . Drug use: No  . Sexual activity: Not on file  Other Topics Concern  . Not on file  Social History Narrative   Married to Dr. Bjorn Loser   One daughter   Social Determinants of Health   Financial Resource Strain:   . Difficulty of Paying Living Expenses: Not on file  Food Insecurity:   . Worried About Charity fundraiser in the Last Year: Not on file  . Ran Out of Food in the Last Year: Not on file  Transportation Needs:   . Lack of Transportation (Medical): Not on file  . Lack of Transportation (Non-Medical): Not on file  Physical Activity:   . Days of Exercise per Week: Not on file  . Minutes of Exercise per Session: Not on file  Stress:   . Feeling of Stress : Not on file  Social Connections:   . Frequency of Communication with Friends and Family: Not on file  . Frequency of Social Gatherings with Friends and Family: Not on file  . Attends Religious Services: Not on file  . Active Member of Clubs or Organizations: Not on file  . Attends Archivist Meetings: Not on file  . Marital Status: Not on file  Intimate Partner Violence:   . Fear of Current or Ex-Partner: Not on file  . Emotionally Abused: Not on file  . Physically Abused: Not on file  . Sexually Abused: Not on file    Family History  Problem Relation Age of Onset  . Colon polyps Mother   . Colon cancer Maternal Grandfather 58  . Breast cancer Maternal Grandmother   . Esophageal cancer Maternal Grandmother   . Rectal cancer Neg Hx   . Stomach cancer Neg Hx     Past Medical History:  Diagnosis Date  . Allergy   . Anemia   . Anxiety   . Blood transfusion without reported diagnosis    after c-section  . Gastroparesis    peripartum  . Hx of sessile serrated colonic polyp 11/16/2018  . Hypothyroidism   . Kidney stones   . Osteopenia   . Postpartum depression     Patient Active Problem List   Diagnosis Date Noted  .  Paresthesias 10/25/2019  . Hx of sessile serrated colonic polyp 11/16/2018    Past Surgical History:  Procedure Laterality Date  . BREAST BIOPSY Left 2015   benign  . BREAST BIOPSY Left 02/01/2019  . CESAREAN SECTION     uterus repair for perforation  . COLONOSCOPY  multiple  . [place]   got septic afterwards.    Current Outpatient Medications  Medication Sig Dispense Refill  . CYTOMEL 5 MCG tablet Take 2 tablets by mouth.    . Levothyroxine Sodium (SYNTHROID PO) Take 1 tablet by mouth daily.    Marland Kitchen MAGNESIUM PO Take 1 tablet by mouth daily.    Marland Kitchen OVER THE COUNTER MEDICATION Elderberry gummies three daily.    Marland Kitchen  OVER THE COUNTER MEDICATION Boron, 75 mg one tablet daily. Patient takes half a tablet.    . potassium chloride SA (K-DUR,KLOR-CON) 20 MEQ tablet Take 20 mEq by mouth daily.    . Probiotic Product (PROBIOTIC DAILY PO) Take 1 capsule by mouth daily.    Marland Kitchen VITAMIN D, CHOLECALCIFEROL, PO Take 1 tablet by mouth daily.     No current facility-administered medications for this visit.    Allergies as of 10/25/2019 - Review Complete 11/09/2018  Allergen Reaction Noted  . Sulfa antibiotics Hives and Itching 10/09/2012    Vitals: LMP 05/01/2014 (Within Months) Comment: Pt states that she has not had a period in 2 years, then had one ~35-40 days ago.  BP 116/65, Pulse 90, height 65.5 inches, weight 125 Last Weight:  Wt Readings from Last 1 Encounters:  11/09/18 123 lb (55.8 kg)   Last Height:   Ht Readings from Last 1 Encounters:  11/09/18 5' 6" (1.676 m)     Physical exam: Exam: Gen: NAD, conversant, well nourised, well groomed                     CV: RRR, no MRG. No Carotid Bruits. No peripheral edema, warm, nontender Eyes: Conjunctivae clear without exudates or hemorrhage  Neuro: Detailed Neurologic Exam  Speech:    Speech is normal; fluent and spontaneous with normal comprehension.  Cognition:    The patient is oriented to person, place, and  time;     recent and remote memory intact;     language fluent;     normal attention, concentration,     fund of knowledge Cranial Nerves:    The pupils are equal, round, and reactive to light. Could not visualize fundi due to small pupils. Visual fields are full to finger confrontation. Extraocular movements are intact. Trigeminal sensation is intact and the muscles of mastication are normal. The face is symmetric. The palate elevates in the midline. Hearing intact. Voice is normal. Shoulder shrug is normal. The tongue has normal motion without fasciculations.   Coordination:    No dysmetria or ataxia  Gait:    Normal native gait  Motor Observation:    No asymmetry, no atrophy, and no involuntary movements noted. Tone:    Normal muscle tone.    Posture:    Posture is normal. normal erect    Strength:    Strength is V/V in the upper and lower limbs.      Sensation: intact to LT     Reflex Exam:  DTR's:    Deep tendon reflexes in the upper and lower extremities are normal bilaterally.   Toes:    The toes are downgoing bilaterally.   Clonus:    Clonus is absent.    Assessment/Plan:  54 y.o. female here as requested by Dr. Amedeo Plenty for cubital tunnel syndrome and Dr. Ronald Lobo for evaluation of paresthesias. PMHx elevated ferritin, Hashimoto's hypothyroidism, osteopenia, parasthesias.  Very lovely 54 year old with a curious constellation of symptoms including twitches in the upper lip, feelings of stiffness and edema in her lower legs and joints but no visible edema, weakness and sensory changes in digits 4-5 of the bilateral hands, difficulty bending her toes, numbness in her lips bilaterally and tip of tongue after wearing a mask all day and then worsening numbness in the face bilaterally also after wearing a mask all day, pressure and numbness feeling on her low back, difficulty getting the words out; she says that the symptoms are random  and come and go not continuous.  Her  neurologic exam is normal.   She is pending results of MRI of the cervical spine completed at [facility] which I do not have access to but we will request results, also pending blood work results from Dr. Shaw. Will perform emg/ncs study today.  After review of the above may consider ordering additional blood work and MRI of the brain however I cannot think of one unifying primary neurologic diagnosis for all her symptoms.  Addendum 10/31/2019: I have spoke to both Dr. Gramig and Dr. Shaw, no etiology for symptoms in MRI cervical or in labs, I will order MRI brain w/wo to eval for MS and order lab workup.   Orders Placed This Encounter  Procedures  . MR BRAIN W WO CONTRAST  . B12 and Folate Panel  . Methylmalonic acid, serum  . ANA  . ANA, IFA (with reflex)  . Sjogren's syndrome antibods(ssa + ssb)  . Rheumatoid factor  . Heavy metals, blood  . Vitamin B6  . Multiple Myeloma Panel (SPEP&IFE w/QIG)  . Vitamin B1  . NCV with EMG(electromyography)   Cc: Dr. Gramig,  Shaw, William, MD  Antonia Ahern, MD  [facility] [address] [place], [place] [address]  Phone [phone] Fax [phone]  

## 2019-10-25 NOTE — Patient Instructions (Signed)

## 2019-10-30 ENCOUNTER — Telehealth: Payer: Self-pay | Admitting: Neurology

## 2019-10-30 NOTE — Telephone Encounter (Signed)
I spoke with the patient and discussed Dr. Cathren Laine message below. Pt is in agreement with having labs done and MRI brain. She was placed on the lab schedule for tomorrow 3/3 @ 10 AM. Pt  understands it can take up to 2 weeks to get the MRI authorization and scheduling completed. Pt did state that she wants the MRI brain no matter what and asked if they scheduled the MRI even before Josem Kaufmann is done. I let her know our office does not usually do that but I would send a message to the auth team so they are aware of her wishes. Her questions were answered during the call. Pt verbalized appreciation.

## 2019-10-30 NOTE — Telephone Encounter (Signed)
Ann Calhoun, I spoke with Dr. Amedeo Plenty and received feedbacl on the results from her MRI c-spine. We will proceed with MRI of the brain. She can also come in tomorrow or Thursday for more labs as I spoke with Dr. Brigitte Pulse and B12 still needs to be completed (it was no successfully added to her labs at his office) and I will also order a few other labs like ANA for rhaumatologic evaluation. thanks

## 2019-10-31 ENCOUNTER — Other Ambulatory Visit (INDEPENDENT_AMBULATORY_CARE_PROVIDER_SITE_OTHER): Payer: Self-pay

## 2019-10-31 DIAGNOSIS — Z0289 Encounter for other administrative examinations: Secondary | ICD-10-CM

## 2019-10-31 DIAGNOSIS — R253 Fasciculation: Secondary | ICD-10-CM

## 2019-10-31 DIAGNOSIS — R200 Anesthesia of skin: Secondary | ICD-10-CM

## 2019-10-31 DIAGNOSIS — R4701 Aphasia: Secondary | ICD-10-CM

## 2019-10-31 DIAGNOSIS — R29898 Other symptoms and signs involving the musculoskeletal system: Secondary | ICD-10-CM

## 2019-10-31 DIAGNOSIS — R202 Paresthesia of skin: Secondary | ICD-10-CM

## 2019-10-31 NOTE — Addendum Note (Signed)
Addended by: Sarina Ill B on: 10/31/2019 07:44 AM   Modules accepted: Orders

## 2019-11-05 ENCOUNTER — Encounter: Payer: PRIVATE HEALTH INSURANCE | Admitting: Neurology

## 2019-11-05 NOTE — Telephone Encounter (Signed)
Patient is scheduled at [facility] for 11/27/19 and they obtain the [place] for [employer].

## 2019-11-07 ENCOUNTER — Telehealth: Payer: Self-pay | Admitting: Neurology

## 2019-11-08 ENCOUNTER — Other Ambulatory Visit: Payer: Self-pay | Admitting: Neurology

## 2019-11-08 LAB — METHYLMALONIC ACID, SERUM: Methylmalonic Acid: 205 nmol/L (ref 0–378)

## 2019-11-08 LAB — MULTIPLE MYELOMA PANEL, SERUM
Albumin SerPl Elph-Mcnc: 4.1 g/dL (ref 2.9–4.4)
Albumin/Glob SerPl: 1.6 (ref 0.7–1.7)
Alpha 1: 0.2 g/dL (ref 0.0–0.4)
Alpha2 Glob SerPl Elph-Mcnc: 0.7 g/dL (ref 0.4–1.0)
B-Globulin SerPl Elph-Mcnc: 1 g/dL (ref 0.7–1.3)
Gamma Glob SerPl Elph-Mcnc: 0.9 g/dL (ref 0.4–1.8)
Globulin, Total: 2.7 g/dL (ref 2.2–3.9)
IgA/Immunoglobulin A, Serum: 230 mg/dL (ref 87–352)
IgG (Immunoglobin G), Serum: 798 mg/dL (ref 586–1602)
IgM (Immunoglobulin M), Srm: 121 mg/dL (ref 26–217)
Total Protein: 6.8 g/dL (ref 6.0–8.5)

## 2019-11-08 LAB — SJOGREN'S SYNDROME ANTIBODS(SSA + SSB)
ENA SSA (RO) Ab: 0.5 AI (ref 0.0–0.9)
ENA SSB (LA) Ab: <0.2 AI (ref 0.0–0.9)

## 2019-11-08 LAB — B12 AND FOLATE PANEL
Folate: 7.1 ng/mL (ref >3.0)
Vitamin B-12: 515 pg/mL (ref 232–1245)

## 2019-11-08 LAB — VITAMIN B1: Thiamine: 73.2 nmol/L (ref 66.5–200.0)

## 2019-11-08 LAB — ANA: ANA Titer 1: NEGATIVE

## 2019-11-08 LAB — HEAVY METALS, BLOOD
Arsenic: 8 ug/L (ref 2–23)
Lead, Blood: <1 ug/dL (ref 0–4)
Mercury: 3.8 ug/L (ref 0.0–14.9)

## 2019-11-08 LAB — RHEUMATOID FACTOR: Rheumatoid fact SerPl-aCnc: <10 IU/mL (ref 0.0–13.9)

## 2019-11-08 LAB — VITAMIN B6: Vitamin B6: 22 ug/L (ref 2.0–32.8)

## 2019-11-08 NOTE — Telephone Encounter (Signed)
Pt called back in regards to missed call  

## 2019-11-08 NOTE — Telephone Encounter (Signed)
Ok but please tell me when because I want to talk to neuroradiology

## 2019-11-08 NOTE — Addendum Note (Signed)
Addended by: Sarina Ill B on: 11/08/2019 10:26 AM   Modules accepted: Orders

## 2019-11-08 NOTE — Telephone Encounter (Signed)
LVM for pt to call back about scheduling mri  [employer] auth: [id number] (exp. 11/08/19 to 02/06/20)

## 2019-11-08 NOTE — Telephone Encounter (Signed)
When you get a chance can you put a new order in for the patient I am going to schedule her her for a sooner appt. Thank you!

## 2019-11-08 NOTE — Telephone Encounter (Signed)
no to the covid questions MR Brain w/wo contrast Dr. Ian Malkin Josem Kaufmann: [id number] (exp. 11/08/19 to 02/06/20).   Patient is scheduled at [facility] for 11/14/19.

## 2019-11-14 ENCOUNTER — Ambulatory Visit: Payer: PRIVATE HEALTH INSURANCE

## 2019-11-14 DIAGNOSIS — R202 Paresthesia of skin: Secondary | ICD-10-CM

## 2019-11-14 DIAGNOSIS — R253 Fasciculation: Secondary | ICD-10-CM

## 2019-11-14 DIAGNOSIS — R200 Anesthesia of skin: Secondary | ICD-10-CM | POA: Diagnosis not present

## 2019-11-14 DIAGNOSIS — R29898 Other symptoms and signs involving the musculoskeletal system: Secondary | ICD-10-CM | POA: Diagnosis not present

## 2019-11-14 DIAGNOSIS — R4701 Aphasia: Secondary | ICD-10-CM

## 2019-11-14 MED ORDER — GADOBENATE DIMEGLUMINE 529 MG/ML IV SOLN
10.0000 mL | Freq: Once | INTRAVENOUS | Status: AC | PRN
  Administered 2019-11-14: 10 mL via INTRAVENOUS

## 2019-11-26 ENCOUNTER — Ambulatory Visit: Payer: PRIVATE HEALTH INSURANCE | Admitting: Neurology

## 2019-11-27 ENCOUNTER — Other Ambulatory Visit: Payer: PRIVATE HEALTH INSURANCE

## 2020-01-21 ENCOUNTER — Encounter (HOSPITAL_COMMUNITY): Payer: PRIVATE HEALTH INSURANCE

## 2020-01-21 ENCOUNTER — Encounter: Payer: PRIVATE HEALTH INSURANCE | Admitting: Surgery

## 2020-02-15 ENCOUNTER — Other Ambulatory Visit: Payer: Self-pay | Admitting: *Deleted

## 2020-02-15 DIAGNOSIS — R0989 Other specified symptoms and signs involving the circulatory and respiratory systems: Secondary | ICD-10-CM

## 2020-02-18 ENCOUNTER — Telehealth: Payer: Self-pay | Admitting: Internal Medicine

## 2020-02-18 NOTE — Telephone Encounter (Signed)
I spoke with Seth Bake and set her up an appointment for this coming Thursday. I called Dr Raul Del office and they will fax [place] records.   Kaydie said her main issue is burping non-stop and light colored stool. She is on a paleo diet and trying to get off this. She is meeting with the nutritionist tomorrow for guidance.

## 2020-02-18 NOTE — Telephone Encounter (Signed)
Husband Dr. Matilde Sprang texted and called re: appointment for his wife Ann Calhoun re: abdominal discomfort/food intolerance issues.  Please offer her an appointment - we can use an 1130 or a 350 spot   If possible get records from Dr. Lang Snow - any labs in past 6 mos and recent office notes.  She is already a mutual patient so hopefully do not need ROI

## 2020-02-21 ENCOUNTER — Encounter: Payer: Self-pay | Admitting: Internal Medicine

## 2020-02-21 ENCOUNTER — Other Ambulatory Visit (INDEPENDENT_AMBULATORY_CARE_PROVIDER_SITE_OTHER): Payer: PRIVATE HEALTH INSURANCE

## 2020-02-21 ENCOUNTER — Ambulatory Visit (INDEPENDENT_AMBULATORY_CARE_PROVIDER_SITE_OTHER): Payer: PRIVATE HEALTH INSURANCE | Admitting: Internal Medicine

## 2020-02-21 VITALS — BP 80/52 | HR 80 | Ht 66.0 in | Wt 115.0 lb

## 2020-02-21 DIAGNOSIS — R195 Other fecal abnormalities: Secondary | ICD-10-CM

## 2020-02-21 DIAGNOSIS — R1013 Epigastric pain: Secondary | ICD-10-CM

## 2020-02-21 DIAGNOSIS — R634 Abnormal weight loss: Secondary | ICD-10-CM

## 2020-02-21 DIAGNOSIS — R1012 Left upper quadrant pain: Secondary | ICD-10-CM

## 2020-02-21 DIAGNOSIS — R142 Eructation: Secondary | ICD-10-CM

## 2020-02-21 DIAGNOSIS — R202 Paresthesia of skin: Secondary | ICD-10-CM

## 2020-02-21 LAB — HEPATIC FUNCTION PANEL
ALT: 21 U/L (ref 0–35)
AST: 17 U/L (ref 0–37)
Albumin: 4.8 g/dL (ref 3.5–5.2)
Alkaline Phosphatase: 79 U/L (ref 39–117)
Bilirubin, Direct: 0.1 mg/dL (ref 0.0–0.3)
Total Bilirubin: 0.6 mg/dL (ref 0.2–1.2)
Total Protein: 7.5 g/dL (ref 6.0–8.3)

## 2020-02-21 LAB — IGA: IgA: 261 mg/dL (ref 68–378)

## 2020-02-21 NOTE — Patient Instructions (Signed)
Your provider has requested that you go to the basement level for lab work before leaving today. Press "B" on the elevator. The lab is located at the first door on the left as you exit the elevator.   You have been given a testing kit to check for small intestine bacterial overgrowth (SIBO) which is completed by a company named [facility]. Make sure to return your test in the mail using the return mailing label given to you along with the kit. Your demographic and insurance information have already been sent to the company and they should be in contact with you over the next week regarding this test. [facility] will collect an upfront charge of $99.74 for commercial insurance plans and $209.74 is you are paying cash. Make sure to discuss with [facility] PRIOR to having the test if they have gotten informatoin from your insurance company as to how much your testing will cost out of pocket, if any. Please keep in mind that you will be getting a call from phone number [phone] or a similar number. If you do not hear from them within this time frame, please call our office at [phone].    You have been scheduled for an endoscopy. Please follow written instructions given to you at your visit today. If you use inhalers (even only as needed), please bring them with you on the day of your procedure.   I appreciate the opportunity to care for you. Silvano Rusk, MD, [facility]

## 2020-02-21 NOTE — Progress Notes (Signed)
Ann Calhoun 54 y.o. Dec 23, 1965 [id number]  Assessment & Plan:   Encounter Diagnoses  Name Primary?   Dyspepsia Yes   LUQ pain    Loss of weight    Loose stools    Paresthesias    Burping    This is quite a complicated set of symptoms.  I think the main issue is early related to her dietary restrictions and probably so or are they part of a broader problem that started with the paresthesias.  Her quality of life is significantly affected.  I plan work-up as below regarding lab studies and an EGD, understanding that she has probably been gluten free but we will check for celiac disease, and understanding a negative test does not rule it out.  Will biopsy for H. pylori ready at the time of endoscopy.  I have explained how belching or burping is often a functional disorder and usually does not represent a significant health problem though again her quality of life is quite disturbed.  We will also test for small intestinal bacterial overgrowth.   Orders Placed This Encounter  Procedures   Calprotectin, Fecal   Tissue transglutaminase, IgA   IgA   Hepatic function panel   Pancreatic Elastase, Fecal   Ambulatory referral to Gastroenterology   I appreciate the opportunity to care for this patient. CC: Marton Redwood, MD   Subjective:   Chief Complaint: burping, stool changes  HPI Ann Calhoun is a 54 year old married white woman with multiple symptoms over the past several months.  She developed problem with paresthesias in her hands and her feet, she has had an extensive work-up with a hand surgeon, neurology and is waiting to see a vascular surgeon and etiology is not apparent.  She also has a history of Hashimoto's thyroiditis and in February of this year went on autoimmune paleo diet to try to improve things somewhat.  Was a very restrictive diet and she is a bit of a picky eater already.  As she tried to reintroduce some foods after being on that diet without  help, she began to have excessive belching postprandial, bloating and loose stools.  Some of the stools were stringy and small.  They have been pale at times and now they are a bit darker but still not typical color.  She has lost weight which she thinks is related to the diet.  She has enlisted the help of a nutritionist as she has had these symptoms begin since reintroducing her more normal foods and she is really only introduce things like egg yolks and green beans, peppers and now some rice so really not far along the way to eating a normal diet again, but she wants to do that.  The nutritionist has recommended limiting FODMAPs in the interim, a trial of Pepto-Bismol was used and did not help with the burping so it was thought that this is probably not in excess of hydrogen sulfide.  The nutritionist has recommended she be tested for several things including H pylori, small intestinal bacterial overgrowth, and fecal calprotectin.  The nutritionist originally suggested if she can to get a triple SIBO test that also tests for hydrogen sulfide in addition to [place] and hydrogen.  The patient wonders if she could have low acid and is inquiring about a [place] acid test.  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  Rheumatoid factors autoimmune testing whole battery of which which are in the chart are negative.  An MRI of the brain is negative.  Nerve conduction studies with EMG negative.  C-reactive protein is normal her thyroid function is normal at this time though she apparently has persistently elevated thyroid peroxidase antibodies.  Followed by Dr. Chalmers Cater.  Also hx anemia and low iron and was supplemented by Dr. Lollie Sails in past.  No hx of any antecedent virus.  Wt Readings from Last 3 Encounters:  02/21/20 115 lb (52.2 kg)  11/09/18 123 lb (55.8 kg)    10/26/18 123 lb (55.8 kg)    Allergies  Allergen Reactions   Sulfa Antibiotics Hives and Itching   Current Meds  Medication Sig   calcium-vitamin D (OSCAL WITH D) 250-125 MG-UNIT tablet Take 1 tablet by mouth daily.   CYTOMEL 5 MCG tablet Take 2 tablets by mouth.   Magnesium 300 MG CAPS Take one in the AM and 2 in the PM   NON FORMULARY Take 1 tablet by mouth daily.   NON FORMULARY Take 1 tablet by mouth daily.   Omega-3 Fatty Acids (FISH OIL) 1000 MG CAPS Take 1 capsule by mouth daily.   SYNTHROID 50 MCG tablet Take 50 mcg by mouth daily.   Thiamine HCl (VITAMIN B-1) 250 MG tablet Take 250 mg by mouth daily.   VITAMIN D, CHOLECALCIFEROL, PO Take 1 tablet by mouth daily.   Past Medical History:  Diagnosis Date   Allergy    Anemia    Anxiety    Blood transfusion without reported diagnosis    after c-section   Gastroparesis    peripartum   Hashimoto's disease    Hx of sessile serrated colonic polyp 11/16/2018   Hypothyroidism    Kidney stones    Nephrolithiasis    in her 20's   Osteopenia    Postpartum depression    Vitamin D deficiency    Past Surgical History:  Procedure Laterality Date   BREAST BIOPSY Left 2015   benign   BREAST BIOPSY Left 02/01/2019   CESAREAN SECTION     uterus repair for perforation   COLONOSCOPY  multiple   [place]   got septic afterwards.   Social History   Social History Narrative   Married to Dr. Nicki Reaper Osmer   One daughter   family history includes Asthma in her brother; Atrial fibrillation in her mother; Breast cancer in her maternal grandmother; CVA in her paternal grandmother; Colon cancer (age of onset: 25) in her maternal grandfather; Colon polyps in her mother; Diabetes in her father; Esophageal cancer in her maternal grandmother; Glaucoma in her maternal grandmother; Hypothyroidism in her mother.   Review of Systems As above  Objective:   Physical Exam .BP (!) 80/52     Pulse 80    Ht 5\' 6"  (1.676 m)    Wt 115 lb (52.2 kg)    LMP 05/01/2014 (Within Months) Comment: Pt states that she has not had a period in 2 years, then had one ~35-40 days ago.   BMI 18.56 kg/m  Thin NAD  abd flat soft mildly tender LUQ   Total time 55 mins

## 2020-02-22 ENCOUNTER — Encounter: Payer: Self-pay | Admitting: Internal Medicine

## 2020-02-22 LAB — TISSUE TRANSGLUTAMINASE, IGA: (tTG) Ab, IgA: 1 U/mL

## 2020-02-25 ENCOUNTER — Ambulatory Visit (HOSPITAL_COMMUNITY)
Admission: RE | Admit: 2020-02-25 | Discharge: 2020-02-25 | Disposition: A | Discharge status: Home / Self Care | Payer: PRIVATE HEALTH INSURANCE | Source: Ambulatory Visit | Attending: Surgery | Admitting: Surgery

## 2020-02-25 ENCOUNTER — Other Ambulatory Visit: Payer: Self-pay

## 2020-02-25 ENCOUNTER — Encounter: Payer: Self-pay | Admitting: Surgery

## 2020-02-25 ENCOUNTER — Ambulatory Visit (INDEPENDENT_AMBULATORY_CARE_PROVIDER_SITE_OTHER): Payer: PRIVATE HEALTH INSURANCE | Admitting: Surgery

## 2020-02-25 VITALS — BP 92/58 | HR 74 | Temp 97.7°F | Resp 14 | Ht 65.5 in | Wt 112.0 lb

## 2020-02-25 DIAGNOSIS — R0989 Other specified symptoms and signs involving the circulatory and respiratory systems: Secondary | ICD-10-CM

## 2020-02-25 NOTE — Progress Notes (Signed)
[facility] [place]  Patient name: Ann Calhoun MRN: [id number] DOB: 05/30/1966 Sex: female   REQUESTING PROVIDER:    self   REASON FOR CONSULT:    Neuropathy  HISTORY OF PRESENT ILLNESS:   Ann Calhoun is a 54 y.o. female, who is referred today for evaluation of atypical symptoms and her arms and legs.  These all began back in January 2021.  She feels that approximately 1 week after trying to do a plank she had issues where she could not move her hands.  Other symptoms include numbness in her lips and the tip of her tongue as well as her toes and feet.  These tend to be exacerbated by pressure such as when she lies down at night or suspends her legs over a chair.  She has had an extensive work-up including evaluation by orthopedic hand surgery as well as neurology.  She has had normal nerve conduction studies.  Her brain and neck MRI were unremarkable.  Her lab work has also been unremarkable.  The patient does have a history of Hashimoto's thyroiditis.  She is a former smoker.  PAST MEDICAL HISTORY    Past Medical History:  Diagnosis Date  . Allergy   . Anemia   . Anxiety   . Blood transfusion without reported diagnosis    after c-section  . Gastroparesis    peripartum  . Hashimoto's disease   . Hx of sessile serrated colonic polyp 11/16/2018  . Hypothyroidism   . Kidney stones   . Nephrolithiasis    in her 20's  . Osteopenia   . Postpartum depression   . Vitamin D deficiency      FAMILY HISTORY   Family History  Problem Relation Age of Onset  . Colon polyps Mother   . Atrial fibrillation Mother   . Hypothyroidism Mother   . Colon cancer Maternal Grandfather 71  . Breast cancer Maternal Grandmother   . Esophageal cancer Maternal Grandmother   . Glaucoma Maternal Grandmother   . Diabetes Father   . Asthma Brother   . CVA Paternal Grandmother   . Rectal cancer Neg Hx   . Stomach  cancer Neg Hx     SOCIAL HISTORY:   Social History   Socioeconomic History  . Marital status: Married    Spouse name: Not on file  . Number of children: Not on file  . Years of education: Not on file  . Highest education level: Not on file  Occupational History  . Not on file  Tobacco Use  . Smoking status: Former Smoker    Quit date: 12/30/1995    Years since quitting: 24.1  . Smokeless tobacco: Never Used  . Tobacco comment: occasional at that time  Vaping Use  . Vaping Use: Never used  Substance and Sexual Activity  . Alcohol use: Yes    Alcohol/week: 4.0 standard drinks    Types: 4 Glasses of wine per week  . Drug use: No  . Sexual activity: Not on file  Other Topics Concern  . Not on file  Social History Narrative   Married to Dr. Bjorn Loser   One daughter   Social Determinants of Health   Financial Resource Strain:   . Difficulty of Paying Living Expenses:   Food Insecurity:   . Worried About Charity fundraiser in the Last Year:   . Arboriculturist in the Last Year:   Transportation Needs:   . Lack  of Transportation (Medical):   Marland Kitchen Lack of Transportation (Non-Medical):   Physical Activity:   . Days of Exercise per Week:   . Minutes of Exercise per Session:   Stress:   . Feeling of Stress :   Social Connections:   . Frequency of Communication with Friends and Family:   . Frequency of Social Gatherings with Friends and Family:   . Attends Religious Services:   . Active Member of Clubs or Organizations:   . Attends Archivist Meetings:   Marland Kitchen Marital Status:   Intimate Partner Violence:   . Fear of Current or Ex-Partner:   . Emotionally Abused:   Marland Kitchen Physically Abused:   . Sexually Abused:     ALLERGIES:    Allergies  Allergen Reactions  . Sulfa Antibiotics Hives and Itching    CURRENT MEDICATIONS:    Current Outpatient Medications  Medication Sig Dispense Refill  . calcium-vitamin D (OSCAL WITH D) 250-125 MG-UNIT tablet Take 1  tablet by mouth daily.    . CYTOMEL 5 MCG tablet Take 2 tablets by mouth.    . Magnesium 300 MG CAPS Take one in the AM and 2 in the PM    . NON FORMULARY Take 1 tablet by mouth daily.    . NON FORMULARY Take 1 tablet by mouth daily.    . Omega-3 Fatty Acids (FISH OIL) 1000 MG CAPS Take 1 capsule by mouth daily.    Marland Kitchen SYNTHROID 50 MCG tablet Take 50 mcg by mouth daily.    . Thiamine HCl (VITAMIN B-1) 250 MG tablet Take 250 mg by mouth daily.    Marland Kitchen VITAMIN D, CHOLECALCIFEROL, PO Take 1 tablet by mouth daily.     No current facility-administered medications for this visit.    REVIEW OF SYSTEMS:   [X]  denotes positive finding, [ ]  denotes negative finding Cardiac  Comments:  Chest pain or chest pressure:    Shortness of breath upon exertion:    Short of breath when lying flat:    Irregular heart rhythm:        Vascular    Pain in calf, thigh, or hip brought on by ambulation:    Pain in feet at night that wakes you up from your sleep:  x   Blood clot in your veins:    Leg swelling:  x       Pulmonary    Oxygen at home:    Productive cough:     Wheezing:         Neurologic    Sudden weakness in arms or legs:  x   Sudden numbness in arms or legs:  x   Sudden onset of difficulty speaking or slurred speech:    Temporary loss of vision in one eye:     Problems with dizziness:  x       Gastrointestinal    Blood in stool:      Vomited blood:         Genitourinary    Burning when urinating:     Blood in urine:        Psychiatric    Major depression:         Hematologic    Bleeding problems:    Problems with blood clotting too easily:        Skin    Rashes or ulcers:        Constitutional    Fever or chills:     PHYSICAL EXAM:   Vitals:  02/25/20 1328 02/25/20 1331  BP: 93/60 (!) 92/58  Pulse: 80 74  Resp: 14   Temp: 97.7 F (36.5 C)   TempSrc: Temporal   SpO2: 98%   Weight: 112 lb (50.8 kg)   Height: 5' 5.5" (1.664 m)     GENERAL: The patient is a  well-nourished female, in no acute distress. The vital signs are documented above. CARDIAC: There is a regular rate and rhythm.  VASCULAR: Palpable dorsalis pedis pulses bilaterally.  Palpable popliteal and radial pulses bilaterally PULMONARY: Nonlabored respirations MUSCULOSKELETAL: There are no major deformities or cyanosis. NEUROLOGIC: No focal weakness or paresthesias are detected. SKIN: There are no ulcers or rashes noted. PSYCHIATRIC: The patient has a normal affect.  STUDIES:   I have reviewed the following: Right Carotid: There was no evidence of thrombus, dissection,  atherosclerotic         plaque or stenosis in the cervical carotid system.   Left Carotid: There was no evidence of thrombus, dissection,  atherosclerotic        plaque or stenosis in the cervical carotid system.   Vertebrals: Bilateral vertebral arteries demonstrate antegrade flow.  Subclavians: Normal flow hemodynamics were seen in bilateral subclavian        arteries.   ASSESSMENT and PLAN   Intermittent numbness and tingling to the arms and feet as well as perioral area: I discussed with the patient that her carotid ultrasound was unremarkable.  The distribution of her symptoms is global and so I do not think that there is a arterial or venous etiology.  I suspect this is some form of a rheumatologic or autoimmune disorder given the multiple sites of concern.  Certainly, there is no role for vascular intervention at this time.  She appears to have had a thorough work-up which has been unremarkable.  At this time would consider referral to rheumatology.  In addition, the patient is concerned about her thyroid levels.  She might benefit from a more complete thyroid evaluation other than just a TSH.   Leia Alf, MD, FACS [facility] [phone] Pager [phone]

## 2020-02-26 ENCOUNTER — Ambulatory Visit (INDEPENDENT_AMBULATORY_CARE_PROVIDER_SITE_OTHER): Payer: PRIVATE HEALTH INSURANCE

## 2020-02-26 ENCOUNTER — Other Ambulatory Visit: Payer: PRIVATE HEALTH INSURANCE

## 2020-02-26 ENCOUNTER — Other Ambulatory Visit: Payer: Self-pay | Admitting: Internal Medicine

## 2020-02-26 DIAGNOSIS — Z1159 Encounter for screening for other viral diseases: Secondary | ICD-10-CM

## 2020-02-26 DIAGNOSIS — R1013 Epigastric pain: Secondary | ICD-10-CM

## 2020-02-26 DIAGNOSIS — R195 Other fecal abnormalities: Secondary | ICD-10-CM

## 2020-02-26 DIAGNOSIS — R142 Eructation: Secondary | ICD-10-CM

## 2020-02-26 DIAGNOSIS — R202 Paresthesia of skin: Secondary | ICD-10-CM

## 2020-02-26 DIAGNOSIS — R1012 Left upper quadrant pain: Secondary | ICD-10-CM

## 2020-02-26 DIAGNOSIS — R634 Abnormal weight loss: Secondary | ICD-10-CM

## 2020-02-27 LAB — SARS CORONAVIRUS 2 (TAT 6-24 HRS): SARS Coronavirus 2: NEGATIVE

## 2020-02-28 ENCOUNTER — Encounter: Payer: Self-pay | Admitting: Internal Medicine

## 2020-02-28 ENCOUNTER — Other Ambulatory Visit: Payer: Self-pay

## 2020-02-28 ENCOUNTER — Ambulatory Visit (AMBULATORY_SURGERY_CENTER): Payer: PRIVATE HEALTH INSURANCE | Admitting: Internal Medicine

## 2020-02-28 VITALS — BP 90/50 | HR 70 | Temp 98.6°F | Resp 12 | Ht 66.0 in | Wt 115.0 lb

## 2020-02-28 DIAGNOSIS — R1013 Epigastric pain: Secondary | ICD-10-CM | POA: Diagnosis present

## 2020-02-28 MED ORDER — SODIUM CHLORIDE 0.9 % IV SOLN: 500.0000 mL | INTRAVENOUS | Status: DC

## 2020-02-28 NOTE — Progress Notes (Signed)
To Pacu, VSS. Report to Rn. Physician aware of additional meds. tb

## 2020-02-28 NOTE — Progress Notes (Signed)
Called to room to assist during endoscopic procedure.  Patient ID and intended procedure confirmed with present staff. Received instructions for my participation in the procedure from the performing physician.  

## 2020-02-28 NOTE — Progress Notes (Signed)

## 2020-02-28 NOTE — Op Note (Signed)
[place] Patient Name: Ann Calhoun Procedure Date: 02/28/2020 1:34 PM MRN: [id number] Endoscopist: Gatha Mayer , MD Age: 54 Referring MD:  Date of Birth: December 26, 1965 Gender: Female Account #: [id number] Procedure:                Upper GI endoscopy Indications:              Epigastric abdominal pain, Dyspepsia Medicines:                Propofol per Anesthesia, Monitored Anesthesia Care Procedure:                Pre-Anesthesia Assessment:                           - Prior to the procedure, a History and Physical                            was performed, and patient medications and                            allergies were reviewed. The patient's tolerance of                            previous anesthesia was also reviewed. The risks                            and benefits of the procedure and the sedation                            options and risks were discussed with the patient.                            All questions were answered, and informed consent                            was obtained. Prior Anticoagulants: The patient has                            taken no previous anticoagulant or antiplatelet                            agents. ASA Grade Assessment: II - A patient with                            mild systemic disease. After reviewing the risks                            and benefits, the patient was deemed in                            satisfactory condition to undergo the procedure.                           After obtaining informed consent, the endoscope was  passed under direct vision. Throughout the                            procedure, the patient's blood pressure, pulse, and                            oxygen saturations were monitored continuously. The                            Endoscope was introduced through the mouth, and                            advanced to the second part of duodenum. The upper                             GI endoscopy was accomplished without difficulty.                            The patient tolerated the procedure well. Scope In: Scope Out: Findings:                 The examined esophagus was normal.                           The entire examined stomach was normal. Biopsies                            were taken with a cold forceps for Helicobacter                            pylori testing using CLOtest. Verification of                            patient identification for the specimen was done.                            Estimated blood loss was minimal.                           The examined duodenum was normal.                           The cardia and gastric fundus were normal on                            retroflexion.                           The gastroesophageal flap valve was visualized                            endoscopically and classified as Hill Grade II                            (fold present, opens with respiration). Complications:  No immediate complications. Estimated Blood Loss:     Estimated blood loss was minimal. Impression:               - Normal esophagus.                           - Normal stomach. Biopsied.                           - Normal examined duodenum.                           - Gastroesophageal flap valve classified as Hill                            Grade II (fold present, opens with respiration). Recommendation:           - Patient has a contact number available for                            emergencies. The signs and symptoms of potential                            delayed complications were discussed with the                            patient. Return to normal activities tomorrow.                            Written discharge instructions were provided to the                            patient.                           - Resume previous diet.                           - Continue present medications.                           -  Await pathology results. Gatha Mayer, MD 02/28/2020 2:01:24 PM This report has been signed electronically.

## 2020-02-28 NOTE — Patient Instructions (Addendum)
The exam of the esophagus, stomach and intestine look ok.  I did test for H pylori.  I am glad to hear that you are a bit better and hope that continues.  Will be in touch with results.  I appreciate the opportunity to care for you. Gatha Mayer, MD, FACG    YOU HAD AN ENDOSCOPIC PROCEDURE TODAY AT [facility]:   Refer to the procedure report that was given to you for any specific questions about what was found during the examination.  If the procedure report does not answer your questions, please call your gastroenterologist to clarify.  If you requested that your care partner not be given the details of your procedure findings, then the procedure report has been included in a sealed envelope for you to review at your convenience later.  YOU SHOULD EXPECT: Some feelings of bloating in the abdomen. Passage of more gas than usual.  Walking can help get rid of the air that was put into your GI tract during the procedure and reduce the bloating. If you had a lower endoscopy (such as a colonoscopy or flexible sigmoidoscopy) you may notice spotting of blood in your stool or on the toilet paper. If you underwent a bowel prep for your procedure, you may not have a normal bowel movement for a few days.  Please Note:  You might notice some irritation and congestion in your nose or some drainage.  This is from the oxygen used during your procedure.  There is no need for concern and it should clear up in a day or so.  SYMPTOMS TO REPORT IMMEDIATELY:   Following upper endoscopy (EGD)  Vomiting of blood or coffee ground material  New chest pain or pain under the shoulder blades  Painful or persistently difficult swallowing  New shortness of breath  Fever of 100F or higher  Black, tarry-looking stools  For urgent or emergent issues, a gastroenterologist can be reached at any hour by calling [phone]. Do not use MyChart messaging for urgent concerns.    DIET:  We do  recommend a small meal at first, but then you may proceed to your regular diet.  Drink plenty of fluids but you should avoid alcoholic beverages for 24 hours.  ACTIVITY:  You should plan to take it easy for the rest of today and you should NOT DRIVE or use heavy machinery until tomorrow (because of the sedation medicines used during the test).    FOLLOW UP: Our staff will call the number listed on your records 48-72 hours following your procedure to check on you and address any questions or concerns that you may have regarding the information given to you following your procedure. If we do not reach you, we will leave a message.  We will attempt to reach you two times.  During this call, we will ask if you have developed any symptoms of COVID 19. If you develop any symptoms (ie: fever, flu-like symptoms, shortness of breath, cough etc.) before then, please call [phone].  If you test positive for Covid 19 in the 2 weeks post procedure, please call and report this information to [place].    If any biopsies were taken you will be contacted by phone or by letter within the next 1-3 weeks.  Please call us at [phone] if you have not heard about the biopsies in 3 weeks.    SIGNATURES/CONFIDENTIALITY: You and/or your care partner have signed paperwork which will be entered into  your electronic medical record.  These signatures attest to the fact that that the information above on your After Visit Summary has been reviewed and is understood.  Full responsibility of the confidentiality of this discharge information lies with you and/or your care-partner.

## 2020-02-29 LAB — HELICOBACTER PYLORI SCREEN-BIOPSY: UREASE: NEGATIVE

## 2020-02-29 LAB — CALPROTECTIN, FECAL: Calprotectin, Fecal: 30 ug/g (ref 0–120)

## 2020-02-29 LAB — PANCREATIC ELASTASE, FECAL: Pancreatic Elastase, Fecal: 245 ug Elast./g (ref >200)

## 2020-03-04 ENCOUNTER — Encounter: Payer: Self-pay | Admitting: Internal Medicine

## 2020-03-04 ENCOUNTER — Telehealth: Payer: Self-pay

## 2020-03-04 NOTE — Telephone Encounter (Signed)
First attempt follow up call to pt, lm on vm 

## 2020-03-04 NOTE — Telephone Encounter (Signed)
°  Follow up Call-  Call back number 02/28/2020 11/09/2018  Post procedure Call Back phone  # [phone]  Permission to leave phone message Yes Yes  Some recent data might be hidden     Patient questions:  Do you have a fever, pain , or abdominal swelling? No. Pain Score  0 *  Have you tolerated food without any problems? Yes.    Have you been able to return to your normal activities? Yes.    Do you have any questions about your discharge instructions: Diet   No. Medications  No. Follow up visit  No.  Do you have questions or concerns about your Care? No.  Actions: * If pain score is 4 or above: No action needed, pain <4.  1. Have you developed a fever since your procedure? no  2.   Have you had an respiratory symptoms (SOB or cough) since your procedure? no  3.   Have you tested positive for COVID 19 since your procedure no  4.   Have you had any family members/close contacts diagnosed with the COVID 19 since your procedure?  no   If yes to any of these questions please route to Joylene John, RN and Erenest Rasher, RN

## 2020-03-07 ENCOUNTER — Telehealth: Payer: Self-pay | Admitting: Internal Medicine

## 2020-03-07 NOTE — Telephone Encounter (Signed)
Called patient back, and she states she had a quarter size bright red blood spot with her urine this morning. States she has no other symptoms of a UTI. Wanted to know if it could be related to her EGD on 02/28/20. I told her it should not because the GI and Urinary systems are separate. She has an appt. With her Urologist today to F/U on the blood in her urine.

## 2020-03-11 ENCOUNTER — Telehealth: Payer: Self-pay

## 2020-03-11 NOTE — Telephone Encounter (Signed)
Ann Calhoun with [facility] called to report a positive SIBO test on Ann Calhoun. He said it is positive at end of small intestine. He wanted me to let Dr Carlean Purl know if he wants to discuss this with him for him to call his personal # [phone]. I will forward this information to him.

## 2020-03-15 ENCOUNTER — Other Ambulatory Visit: Payer: Self-pay | Admitting: Internal Medicine

## 2020-03-15 ENCOUNTER — Telehealth: Payer: Self-pay | Admitting: Internal Medicine

## 2020-03-15 DIAGNOSIS — K6389 Other specified diseases of intestine: Secondary | ICD-10-CM | POA: Insufficient documentation

## 2020-03-15 MED ORDER — RIFAXIMIN 550 MG PO TABS: 550.0000 mg | ORAL_TABLET | Freq: Three times a day (TID) | ORAL | 0 refills | Status: DC

## 2020-03-15 NOTE — Telephone Encounter (Signed)
Called and explained that SIBO test +   Will rx Xifaxan 550 mg tid x 14 days and asked her to update me on sxs (belching, mixed bowel habit changes, gas) 2 weeks after completion

## 2020-03-31 ENCOUNTER — Other Ambulatory Visit: Payer: Self-pay | Admitting: Internal Medicine

## 2020-04-04 ENCOUNTER — Telehealth: Payer: Self-pay | Admitting: Internal Medicine

## 2020-04-04 NOTE — Telephone Encounter (Signed)
Spoke with patient, she states that she is still having burping symptoms and has about 2 days of Xifaxan left. Pt states that she would like to be seen because she has a lot of concerns and questions. Pt states that her Nutritionist has ran more labs - pt states that she will bring a copy of the lab results to the office on either Monday or Tuesday for your review. Your next available appt is not until Oct, patient did not want to wait that long to be seen. She is scheduled to see Christianne Dolin on 05/14/20 at 2 pm. Patient also wanted you to know that she did not request a refill of Xifaxan and that it was her pharmacy and their automated system. Patient wants to rule our gastritis or an ulcer. Pt had colon 10/2018 and EGD on 02/2020. Please advise on any further recommendations, until appt. Thank you

## 2020-04-04 NOTE — Telephone Encounter (Signed)
Pt is requesting a call back from a nurse to discuss the treatment Dr Carlean Purl wanted her to start. Pt would like to be seen by him sometime soon but no appts available until Oct.

## 2020-04-07 NOTE — Telephone Encounter (Signed)
Spoke with patient, pt aware that once she drops off results you will review them and be in contact with her. Pt advised that we will cancel appt with Janett Billow Zehr-PA since you will give her a call. Pt states that she will bring results by sometime tomorrow for Dr. Celesta Aver review. Pt agrees to plan.

## 2020-04-07 NOTE — Telephone Encounter (Signed)
Let's do this  Once I see the lab results I will call her and review on the phone.  Complicated situation and I do not think we need to have [name] try to sort out something I know about already.  Explain to patient I will call her and discuss things and if we need a face-to-face will arrange with me sooner than Oct

## 2020-04-08 ENCOUNTER — Telehealth: Payer: Self-pay | Admitting: Internal Medicine

## 2020-04-08 NOTE — Telephone Encounter (Signed)
Noted  

## 2020-04-16 ENCOUNTER — Telehealth: Payer: Self-pay | Admitting: Internal Medicine

## 2020-04-16 NOTE — Telephone Encounter (Signed)
Spoke with patient - Patient brought results by last week in an envelope for you, have you been able to review them? See phone note from 04/04/20. Thanks

## 2020-04-16 NOTE — Telephone Encounter (Signed)
Spoke with patient, she is aware that you will give her a call Friday.

## 2020-04-16 NOTE — Telephone Encounter (Signed)
Please let her know I plan to call Friday as am unavailable til then

## 2020-04-18 ENCOUNTER — Telehealth: Payer: Self-pay | Admitting: Internal Medicine

## 2020-04-18 DIAGNOSIS — R142 Eructation: Secondary | ICD-10-CM

## 2020-04-18 NOTE — Telephone Encounter (Signed)
Ann Calhoun and I spoke about her persistent gastrointestinal symptoms. I have reviewed a lot of blood work and stool testing that she had done through her clinical nutritionist. She sent me a note as well and we talked about that. She continues to have a lot of belching that is uncontrollable and bothersome to her quality of life and cannot understand why this started. She is about a week from finishing Xifaxan for hydrogen breath test that suggested SIBO.    She is wondering about a structural evaluation given that she had flat valve Hill grade 2 and whether or not there is something like a hiatal hernia causing this increased excessive belching.   She was started on a digestive enzyme because what sounds like they mixed up the value of 30 which was fecal calprotectin and not her fecal elastase which was greater than 200 which is normal. She said pancreatic enzymes have not helped and she would stop those.  She has developed diarrhea when she takes her calcium carbonate so she has stopped that. She is using magnesium supplement to help with constipation. Never had bowel irregularity until all of this began earlier this year. She is getting hormone testing from gynecology. She wonders if her motility is slow causing SIBO. Wondering if she needs to take hydrochloric acid which I do not think so. She is having a thyroid test soon though TSH and T4 were normal in February. 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  At this point we will try to tackle the belching in sort out whether it is supragastric or intragastric belching using esophageal manometry and a 24-hour pH impedance probe.  I have asked  her to try diaphragmatic breathing techniques  Also note she is convinced she is not swallowing air i.e. is not having aerophagia.

## 2020-04-21 NOTE — Telephone Encounter (Signed)
First available 2h hour pH study and mano are 06/25/20 12:30 Left message for patient to call back New instructions mailed to the patient

## 2020-04-22 NOTE — Telephone Encounter (Signed)
Patient notified of the appt dates and times.  Assisted her to schedule her COVID screen for an earlier time.  She will call back for any additional questions or concerns.

## 2020-05-14 ENCOUNTER — Ambulatory Visit: Payer: PRIVATE HEALTH INSURANCE | Admitting: Gastroenterology

## 2020-06-17 ENCOUNTER — Other Ambulatory Visit: Payer: Self-pay | Admitting: Obstetrics and Gynecology

## 2020-06-17 DIAGNOSIS — Z1231 Encounter for screening mammogram for malignant neoplasm of breast: Secondary | ICD-10-CM

## 2020-06-20 ENCOUNTER — Other Ambulatory Visit (HOSPITAL_COMMUNITY): Payer: PRIVATE HEALTH INSURANCE

## 2020-06-20 ENCOUNTER — Other Ambulatory Visit (HOSPITAL_COMMUNITY)
Admission: RE | Admit: 2020-06-20 | Discharge: 2020-06-20 | Disposition: A | Discharge status: Home / Self Care | Payer: PRIVATE HEALTH INSURANCE | Source: Ambulatory Visit | Attending: Internal Medicine | Admitting: Internal Medicine

## 2020-06-20 DIAGNOSIS — Z20822 Contact with and (suspected) exposure to covid-19: Secondary | ICD-10-CM | POA: Diagnosis not present

## 2020-06-20 DIAGNOSIS — Z01812 Encounter for preprocedural laboratory examination: Secondary | ICD-10-CM | POA: Diagnosis present

## 2020-06-20 LAB — SARS CORONAVIRUS 2 (TAT 6-24 HRS): SARS Coronavirus 2: NEGATIVE

## 2020-06-25 ENCOUNTER — Ambulatory Visit (HOSPITAL_COMMUNITY)
Admission: RE | Admit: 2020-06-25 | Discharge: 2020-06-25 | Disposition: A | Discharge status: Home / Self Care | Payer: PRIVATE HEALTH INSURANCE | Attending: Internal Medicine | Admitting: Internal Medicine

## 2020-06-25 ENCOUNTER — Encounter (HOSPITAL_COMMUNITY)
Admission: RE | Disposition: A | Discharge status: Home / Self Care | Payer: Self-pay | Source: Home / Self Care | Attending: Internal Medicine

## 2020-06-25 DIAGNOSIS — R1013 Epigastric pain: Secondary | ICD-10-CM | POA: Insufficient documentation

## 2020-06-25 DIAGNOSIS — R142 Eructation: Secondary | ICD-10-CM

## 2020-06-25 DIAGNOSIS — K219 Gastro-esophageal reflux disease without esophagitis: Secondary | ICD-10-CM

## 2020-06-25 HISTORY — PX: ESOPHAGEAL MANOMETRY: SHX5429

## 2020-06-25 HISTORY — PX: 24 HOUR PH STUDY: SHX5419

## 2020-06-25 SURGERY — MONITORING, ESOPHAGEAL PH, 24 HOUR
Anesthesia: Topical

## 2020-06-25 MED ORDER — LIDOCAINE VISCOUS HCL 2 % MT SOLN
OROMUCOSAL | Status: AC
  Filled 2020-06-25: qty 15

## 2020-06-25 SURGICAL SUPPLY — 2 items
FACESHIELD LNG OPTICON STERILE (SAFETY) IMPLANT
GLOVE BIO SURGEON STRL SZ8 (GLOVE) ×4 IMPLANT

## 2020-06-25 NOTE — Progress Notes (Signed)
Esophageal manometry performed per protocol.  Patient tolerated procedure without any diffuculties.  PH probe then placed at 38 cm at right nare.  Written and verbal education provided on use of equipment and when to return to Endoscopy to have probe removed.  Patient verbalized understanding of all instructions.  Report to be sent to Dr. Harl Bowie.

## 2020-06-27 ENCOUNTER — Encounter (HOSPITAL_COMMUNITY): Payer: Self-pay | Admitting: Internal Medicine

## 2020-07-02 ENCOUNTER — Telehealth: Payer: Self-pay | Admitting: Internal Medicine

## 2020-07-02 NOTE — Telephone Encounter (Signed)
I called Ann Calhoun to follow-up.  I had received communication that she had an abnormal stool through her husband yesterday.  She had some yellowish mucus with a bowel movement that was a little alarming but it has not recurred.  She continues to struggle with frequent belching she completed a manometry and 24 pH impedance probe last week and we're waiting on the results.  She says she was burping more after that initially but then subsequently that has calmed down a bit though she still continues to struggle with burping.  She tells me where she researched online and found a testimonial from a dentist who had very similar problems to chronic frequent belching that she has had, he was diagnosed with a sliding hiatal hernia at [facility] and then went on to do breathing exercises and improved.  She has investigated but not followed through with the breathing exercises yet.  She has seen a physiotherapist who has tried to work with her in some fashion with the idea that she has a hiatal hernia and to try to "bring it down".  She has gained a little bit of weight up about 4 pounds 215 pounds from 111.  She has been released from her FODMAPs diet and is incorporating them now that a repeat breath test by her nutritionist shows resolution of small intestinal bacterial overgrowth.  She remains gluten dairy and egg free however.  Still tends towards constipation taking magnesium twice a day with help.  Calcium gives her diarrhea.  She avoids that.  Synthroid dose recently increased she is hoping that helps with constipation.  Gynecologist is told her her hormones are all low and she needs supplements but they're waiting for the increase in Synthroid to take effect first.  I will call her after the manometry and pH impedance results are in

## 2020-07-08 ENCOUNTER — Telehealth: Payer: Self-pay | Admitting: Internal Medicine

## 2020-07-08 ENCOUNTER — Encounter: Payer: Self-pay | Admitting: Internal Medicine

## 2020-07-08 DIAGNOSIS — R1013 Epigastric pain: Secondary | ICD-10-CM

## 2020-07-08 DIAGNOSIS — R142 Eructation: Secondary | ICD-10-CM

## 2020-07-08 NOTE — Telephone Encounter (Signed)
Called patient to discuss results of esophageal manometry and 24-hour pH impedance probe.  I have verbal reports from the reading physician to tell me the manometry was normal and that the belching episodes were supra gastric in origin.  I explained this to the patient.   Today she says she continues with belching and she also has a vague pressure in the mid to mid lower chest after eating and wants to know what that might be and could something of changed since previous investigations including EGD which was unremarkable this summer.  She has intense interest in understanding if she has a hiatal hernia as she has read testimonial's about a hiatal hernia causing the symptoms including belching.  She is aware that diaphragmatic breathing techniques are often used to treat this.  I explained that she may be subconsciously ingesting air causing this which she finds difficult to believe.  I agree that that is hard to understand but explained that it is possible.  She is keenly interested in additional investigations to look for hiatal hernia so we will schedule an upper GI series.  I had originally said barium swallow but we will explained to her this test gives more information so as I thought more we will do that.  I have emailed her the up-to-date overview of intestinal gas and bloating the covers chronic belching.  I have asked her to look into the possibility of [place]

## 2020-07-10 NOTE — Telephone Encounter (Signed)
Please order upper GI series regarding chest pain question hiatal hernia  See my note for further details I originally told her barium swallow but have changed to upper GI series    Patient notified of change from barium swallow to UGI.  She has been scheduled for [facility] for 07/16/20 10:30. She is asked to arrive at 10:15 and be NPO for 3 hours prior

## 2020-07-16 ENCOUNTER — Other Ambulatory Visit: Payer: Self-pay

## 2020-07-16 ENCOUNTER — Ambulatory Visit (HOSPITAL_COMMUNITY)
Admission: RE | Admit: 2020-07-16 | Discharge: 2020-07-16 | Disposition: A | Discharge status: Home / Self Care | Payer: PRIVATE HEALTH INSURANCE | Source: Ambulatory Visit | Attending: Internal Medicine | Admitting: Internal Medicine

## 2020-07-16 DIAGNOSIS — R1013 Epigastric pain: Secondary | ICD-10-CM

## 2020-07-16 DIAGNOSIS — R142 Eructation: Secondary | ICD-10-CM

## 2020-07-23 DIAGNOSIS — K219 Gastro-esophageal reflux disease without esophagitis: Secondary | ICD-10-CM

## 2020-07-23 DIAGNOSIS — R142 Eructation: Secondary | ICD-10-CM

## 2020-08-06 ENCOUNTER — Encounter: Payer: Self-pay | Admitting: Internal Medicine

## 2020-08-11 ENCOUNTER — Telehealth: Payer: Self-pay

## 2020-08-11 NOTE — Telephone Encounter (Signed)
We have faxed the referral forms to [facility] in [facility]. Fax # [phone], phone # [phone]. Says on the form they try to process it within 7 business days and inform us about scheduling.

## 2020-08-13 ENCOUNTER — Ambulatory Visit
Admission: RE | Admit: 2020-08-13 | Discharge: 2020-08-13 | Disposition: A | Discharge status: Home / Self Care | Payer: PRIVATE HEALTH INSURANCE | Source: Ambulatory Visit | Attending: Obstetrics and Gynecology | Admitting: Obstetrics and Gynecology

## 2020-08-13 ENCOUNTER — Other Ambulatory Visit: Payer: Self-pay

## 2020-08-13 DIAGNOSIS — Z1231 Encounter for screening mammogram for malignant neoplasm of breast: Secondary | ICD-10-CM

## 2020-09-03 NOTE — Telephone Encounter (Signed)
Left [facility] a detailed message at [phone] to call me back, need to check on the status of this referral.

## 2020-10-16 NOTE — Telephone Encounter (Signed)
I spoke with [facility] in [place] to check  on the status of this referral . Phone # [phone], fax # [phone]. They told me that she has an appointment 11/11/20 at 1:00pm. Patient aware.

## 2020-10-21 NOTE — Telephone Encounter (Signed)
error 

## 2020-10-31 ENCOUNTER — Telehealth: Payer: Self-pay

## 2020-10-31 ENCOUNTER — Ambulatory Visit (HOSPITAL_COMMUNITY)
Admission: RE | Admit: 2020-10-31 | Discharge: 2020-10-31 | Disposition: A | Discharge status: Home / Self Care | Payer: PRIVATE HEALTH INSURANCE | Source: Ambulatory Visit | Attending: Pulmonary Disease | Admitting: Pulmonary Disease

## 2020-10-31 ENCOUNTER — Other Ambulatory Visit: Payer: Self-pay | Admitting: Nurse Practitioner

## 2020-10-31 DIAGNOSIS — R202 Paresthesia of skin: Secondary | ICD-10-CM

## 2020-10-31 DIAGNOSIS — U071 COVID-19: Secondary | ICD-10-CM | POA: Diagnosis not present

## 2020-10-31 MED ORDER — DIPHENHYDRAMINE HCL 50 MG/ML IJ SOLN: 50.0000 mg | Freq: Once | INTRAMUSCULAR | Status: DC | PRN

## 2020-10-31 MED ORDER — METHYLPREDNISOLONE SODIUM SUCC 125 MG IJ SOLR: 125.0000 mg | Freq: Once | INTRAMUSCULAR | Status: DC | PRN

## 2020-10-31 MED ORDER — ALBUTEROL SULFATE HFA 108 (90 BASE) MCG/ACT IN AERS: 2.0000 | INHALATION_SPRAY | Freq: Once | RESPIRATORY_TRACT | Status: DC | PRN

## 2020-10-31 MED ORDER — EPINEPHRINE 0.3 MG/0.3ML IJ SOAJ: 0.3000 mg | Freq: Once | INTRAMUSCULAR | Status: DC | PRN

## 2020-10-31 MED ORDER — SODIUM CHLORIDE 0.9 % IV SOLN: INTRAVENOUS | Status: DC | PRN

## 2020-10-31 MED ORDER — SOTROVIMAB 500 MG/8ML IV SOLN
500.0000 mg | Freq: Once | INTRAVENOUS | Status: AC
  Administered 2020-10-31: 500 mg via INTRAVENOUS

## 2020-10-31 MED ORDER — FAMOTIDINE IN NACL 20-0.9 MG/50ML-% IV SOLN: 20.0000 mg | Freq: Once | INTRAVENOUS | Status: DC | PRN

## 2020-10-31 NOTE — Telephone Encounter (Signed)
Called to discuss with patient about COVID-19 symptoms and the use of one of the available treatments for those with mild to moderate Covid symptoms and at a high risk of hospitalization.  Pt appears to qualify for outpatient treatment due to co-morbid conditions and/or a member of an at-risk group in accordance with the FDA Emergency Use Authorization.    Symptom onset: 10/28/20 Cough,sore throat,headache Vaccinated: No Booster? No Immunocompromised? Yes Qualifiers: Hashimoto's    Pt. Would like to speak to APP.  Ann Calhoun

## 2020-10-31 NOTE — Progress Notes (Signed)
Diagnosis: COVID-19  Physician: Dr. Patrick Wright  Procedure: [facility] Infusion [facility] Med: Sotrovimab infusion - Provided patient with sotrovimab fact sheet for patients, parents, and caregivers prior to infusion.   Complications: No immediate complications noted  Discharge: Discharged home    

## 2020-10-31 NOTE — Progress Notes (Signed)
Patient reviewed Fact Sheet for Patients, Parents, and Caregivers for Emergency Use Authorization (EUA) of sotrovimab for the Treatment of Coronavirus. Patient also reviewed and is agreeable to the estimated cost of treatment. Patient is agreeable to proceed.   

## 2020-10-31 NOTE — Discharge Instructions (Signed)

## 2020-10-31 NOTE — Progress Notes (Signed)
I connected by phone with Ann Calhoun on 10/31/2020 at 11:31 AM to discuss the potential use of a new treatment for mild to moderate COVID-19 viral infection in non-hospitalized patients.  This patient is a 55 y.o. female that meets the FDA criteria for Emergency Use Authorization of COVID monoclonal antibody sotrovimab.  Has a (+) direct SARS-CoV-2 viral test result  Has mild or moderate COVID-19   Is NOT hospitalized due to COVID-19  Is within 10 days of symptom onset  Has at least one of the high risk factor(s) for progression to severe COVID-19 and/or hospitalization as defined in EUA.  Specific high risk criteria : BMI > 25 and Immunosuppressive Disease or Treatment   I have spoken and communicated the following to the patient or parent/caregiver regarding COVID monoclonal antibody treatment:  1. FDA has authorized the emergency use for the treatment of mild to moderate COVID-19 in adults and pediatric patients with positive results of direct SARS-CoV-2 viral testing who are 65 years of age and older weighing at least 40 kg, and who are at high risk for progressing to severe COVID-19 and/or hospitalization.  2. The significant known and potential risks and benefits of COVID monoclonal antibody, and the extent to which such potential risks and benefits are unknown.  3. Information on available alternative treatments and the risks and benefits of those alternatives, including clinical trials.  4. Patients treated with COVID monoclonal antibody should continue to self-isolate and use infection control measures (e.g., wear mask, isolate, social distance, avoid sharing personal items, clean and disinfect "high touch" surfaces, and frequent handwashing) according to CDC guidelines.   5. The patient or parent/caregiver has the option to accept or refuse COVID monoclonal antibody treatment.  After reviewing this information with the patient, the patient has agreed to receive one  of the available covid 19 monoclonal antibodies and will be provided an appropriate fact sheet prior to infusion. Jobe Gibbon, NP 10/31/2020 11:31 AM

## 2021-02-09 ENCOUNTER — Other Ambulatory Visit: Payer: Self-pay

## 2021-02-09 ENCOUNTER — Telehealth: Payer: Self-pay

## 2021-02-09 DIAGNOSIS — M7989 Other specified soft tissue disorders: Secondary | ICD-10-CM

## 2021-02-09 NOTE — Telephone Encounter (Signed)
Patient calls to report bilateral lower extremity swelling. She says she is prone to her feet swelling when she is on them for awhile. While they were vacationing at the beach, patient's legs and feet swelled; her sandals were tight. She says she has some splotches from her knees to her ankles, and there is "blood stuff" under her ankles. Her husband is a urologist and suspects she may have stasis dermatitis. Put patient on as self referral vein for o/v and le reflux study.

## 2021-02-12 ENCOUNTER — Other Ambulatory Visit: Payer: Self-pay

## 2021-02-12 ENCOUNTER — Ambulatory Visit (HOSPITAL_COMMUNITY)
Admission: RE | Admit: 2021-02-12 | Discharge: 2021-02-12 | Disposition: A | Discharge status: Home / Self Care | Payer: PRIVATE HEALTH INSURANCE | Source: Ambulatory Visit | Attending: Vascular Surgery | Admitting: Vascular Surgery

## 2021-02-12 ENCOUNTER — Ambulatory Visit (INDEPENDENT_AMBULATORY_CARE_PROVIDER_SITE_OTHER): Payer: PRIVATE HEALTH INSURANCE | Admitting: Physician Assistant

## 2021-02-12 VITALS — BP 93/60 | HR 75 | Temp 98.0°F | Ht 66.0 in | Wt 123.4 lb

## 2021-02-12 DIAGNOSIS — I872 Venous insufficiency (chronic) (peripheral): Secondary | ICD-10-CM | POA: Diagnosis not present

## 2021-02-12 DIAGNOSIS — M7989 Other specified soft tissue disorders: Secondary | ICD-10-CM | POA: Diagnosis present

## 2021-02-12 NOTE — Progress Notes (Signed)
VASCULAR & VEIN SPECIALISTS           [facility] [place]  History and Physical   Trevia Nop Coey is a 55 y.o. female who presents with history of foot and ankle swelling for years.  She has been elevating her legs at night and the swelling is better by the morning.  She states that the swelling is worse with standing and sitting for prolonged periods of time.  She has not tried compression socks.  She does not have any hx of DVT.  She does not have any hx of varicosities or family hx.  Her grandmother died of a stroke.  She does not have any chronic skin changes, but was at the beach recently and developed a rash over both lower legs after prolonged walking on the beach for several hours.  She had gone to the dermatologist and was told it was related to her veins.  The rash has since resolved.  She does not get claudication sx but does get some cramping in her calves at night time.   She presents today for evaluation.    She had seen Dr. Trula Slade a year ago for atcal sx in her arms and legs that began in January 2021.  At that time, she had trouble moving her hands about a week after doing a plank.  She also had numbness in her lips and the tip of her tongue as well as her feet.   These were exacerbated by pressure when she would lay down at night or suspend her legs over a chair.  Had had an extensive workup with hand surgery and neuryology and had normal nerve conduction.  MRI of brain and neck were unremarkable as well as lab work.   She has hx of Hashimoto's thyroiditis.  She has hx  The pt is not on a statin for cholesterol management.  The pt is not on a daily aspirin.   Other AC:  none The pt is not on medication for hypertension.   The pt is not diabetic.   Tobacco hx:  former   Past Medical History:  Diagnosis Date   Allergy    Anemia    Blood transfusion without reported diagnosis    after c-section   Gastroparesis    peripartum   Hashimoto's disease    Hx of  sessile serrated colonic polyp 11/16/2018   Hypothyroidism    Kidney stones    Nephrolithiasis    in her 20's   Osteopenia    Postpartum depression    Vitamin D deficiency     Past Surgical History:  Procedure Laterality Date   67 HOUR [place] STUDY N/A 06/25/2020   Procedure: 24 HOUR [place] STUDY;  Surgeon: Gatha Mayer, MD;  Location: [facility];  Service: Endoscopy;  Laterality: N/A;   BREAST BIOPSY Left 2015   benign   BREAST BIOPSY Left 02/01/2019   CESAREAN SECTION     uterus repair for perforation   COLONOSCOPY  multiple   ESOPHAGEAL MANOMETRY N/A 06/25/2020   Procedure: ESOPHAGEAL MANOMETRY (EM);  Surgeon: Gatha Mayer, MD;  Location: [facility];  Service: Endoscopy;  Laterality: N/A;   [place]   got septic afterwards.    Social History   Socioeconomic History   Marital status: Married    Spouse name: Not on file   Number of children: Not on file   Years of education: Not on file  Highest education level: Not on file  Occupational History   Not on file  Tobacco Use   Smoking status: Former    Pack years: 0.00    Types: Cigarettes    Quit date: 12/30/1995    Years since quitting: 25.1   Smokeless tobacco: Never   Tobacco comments:    occasional at that time  Vaping Use   Vaping Use: Never used  Substance and Sexual Activity   Alcohol use: Yes    Alcohol/week: 4.0 standard drinks    Types: 4 Glasses of wine per week    Comment: not  currently   Drug use: No   Sexual activity: Not on file  Other Topics Concern   Not on file  Social History Narrative   Married to Dr. Bjorn Loser   One daughter   Social Determinants of Health   Financial Resource Strain: Not on file  Food Insecurity: Not on file  Transportation Needs: Not on file  Physical Activity: Not on file  Stress: Not on file  Social Connections: Not on file  Intimate Partner Violence: Not on file    Family History  Problem Relation Age of Onset   Colon polyps  Mother    Atrial fibrillation Mother    Hypothyroidism Mother    Colon cancer Maternal Grandfather 35   Breast cancer Maternal Grandmother    Esophageal cancer Maternal Grandmother    Glaucoma Maternal Grandmother    Diabetes Father    Asthma Brother    CVA Paternal Grandmother    Rectal cancer Neg Hx    Stomach cancer Neg Hx     Current Outpatient Medications  Medication Sig Dispense Refill   calcium-vitamin D (OSCAL WITH D) 250-125 MG-UNIT tablet Take 1 tablet by mouth daily.     CYTOMEL 5 MCG tablet Take 2 tablets by mouth.     Magnesium 300 MG CAPS Take one in the AM and 2 in the PM     NON FORMULARY Take 1 tablet by mouth daily.     NON FORMULARY Take 1 tablet by mouth daily.     Omega-3 Fatty Acids (FISH OIL) 1000 MG CAPS Take 1 capsule by mouth daily.     rifaximin (XIFAXAN) 550 MG TABS tablet Take 1 tablet (550 mg total) by mouth 3 (three) times daily. 42 tablet 0   SYNTHROID 50 MCG tablet Take 50 mcg by mouth daily.     Thiamine HCl (VITAMIN B-1) 250 MG tablet Take 250 mg by mouth daily.     VITAMIN D, CHOLECALCIFEROL, PO Take 1 tablet by mouth daily.     No current facility-administered medications for this visit.    Allergies  Allergen Reactions   Sulfa Antibiotics Hives and Itching    REVIEW OF SYSTEMS:   [X]  denotes positive finding, [ ]  denotes negative finding Cardiac  Comments:  Chest pain or chest pressure:    Shortness of breath upon exertion:    Short of breath when lying flat:    Irregular heart rhythm:        Vascular    Pain in calf, thigh, or hip brought on by ambulation:    Pain in feet at night that wakes you up from your sleep:     Blood clot in your veins:    Leg swelling:  x See HPI      Pulmonary    Oxygen at home:    Productive cough:     Wheezing:  Neurologic    Sudden weakness in arms or legs:     Sudden numbness in arms or legs:     Sudden onset of difficulty speaking or slurred speech:    Temporary loss of vision in  one eye:     Problems with dizziness:         Gastrointestinal    Blood in stool:     Vomited blood:         Genitourinary    Burning when urinating:     Blood in urine:        Psychiatric    Major depression:         Hematologic    Bleeding problems:    Problems with blood clotting too easily:        Skin    Rashes or ulcers:        Constitutional    Fever or chills:      PHYSICAL EXAMINATION:  Today's Vitals   02/12/21 0850  BP: 93/60  Pulse: 75  Temp: 98 F (36.7 C)  TempSrc: Skin  SpO2: 100%  Weight: 123 lb 6.4 oz (56 kg)  Height: 5\' 6"  (1.676 m)   Body mass index is 19.92 kg/m.   General:  WDWN in NAD; vital signs documented above Gait: Not observed HENT: WNL, normocephalic Pulmonary: normal non-labored breathing without wheezing Cardiac: regular HR; without carotid bruits Abdomen: soft, NT, no masses; aortic pulse is not palpable Skin: without rashes Vascular Exam/Pulses:  Right Left  Radial 2+ (normal) 2+ (normal)  DP 2+ (normal) 2+ (normal)  PT Unable to palpate Unable to palpate   Extremities: minimal to no swelling this morning.  No varicosities present or skin changes.  She has a few spider veins on the lateral left thigh.  Neurologic: A&O X 3;  moving all extremities equally Psychiatric:  The pt has Normal affect.   Non-Invasive Vascular Imaging:   Venous duplex on 02/12/2021: Venous Reflux Times  +--------------+---------+------+-----------+------------+--------+  RIGHT         Reflux NoRefluxReflux TimeDiameter cmsComments                          Yes                                   +--------------+---------+------+-----------+------------+--------+  CFV                     yes   >1 second                       +--------------+---------+------+-----------+------------+--------+  FV mid        no                                               +--------------+---------+------+-----------+------------+--------+  Popliteal     no                                              +--------------+---------+------+-----------+------------+--------+  GSV at SFJ              yes    >500 ms  0.64              +--------------+---------+------+-----------+------------+--------+  GSV prox thighno                            0.38              +--------------+---------+------+-----------+------------+--------+  GSV mid thigh no                            0.36              +--------------+---------+------+-----------+------------+--------+  GSV dist thighno                            0.37              +--------------+---------+------+-----------+------------+--------+  GSV at knee   no                            0.31              +--------------+---------+------+-----------+------------+--------+  GSV prox calf no                            0.29              +--------------+---------+------+-----------+------------+--------+  GSV mid calf  no                            0.22              +--------------+---------+------+-----------+------------+--------+  SSV Pop Fossa no                            0.22              +--------------+---------+------+-----------+------------+--------+  SSV prox calf                                       NV        +--------------+---------+------+-----------+------------+--------+  SSV mid calf            yes    >500 ms      0.18              +--------------+---------+------+-----------+------------+--------+  AASV          no                            0.14              +--------------+---------+------+-----------+------------+--------+      +--------------+---------+------+-----------+------------+---------+  LEFT          Reflux NoRefluxReflux TimeDiameter cmsComments                           Yes                                     +--------------+---------+------+-----------+------------+---------+  CFV           no                                               +--------------+---------+------+-----------+------------+---------+  FV mid                  yes   >1 second                        +--------------+---------+------+-----------+------------+---------+  Popliteal     no                                               +--------------+---------+------+-----------+------------+---------+  GSV at [facility]    no                            0.44               +--------------+---------+------+-----------+------------+---------+  GSV prox thighno                            0.45               +--------------+---------+------+-----------+------------+---------+  GSV mid thigh no                            0.34               +--------------+---------+------+-----------+------------+---------+  GSV dist thigh          yes    >500 ms      0.35               +--------------+---------+------+-----------+------------+---------+  GSV at knee             yes    >500 ms      0.30               +--------------+---------+------+-----------+------------+---------+  GSV prox calf           yes    >500 ms      0.23               +--------------+---------+------+-----------+------------+---------+  GSV mid calf            yes    >500 ms      0.22               +--------------+---------+------+-----------+------------+---------+  SSV Pop Fossa no                                    NV         +--------------+---------+------+-----------+------------+---------+  SSV prox calf no                                    Too small  +--------------+---------+------+-----------+------------+---------+  SSV mid calf  no                            0.13               +--------------+---------+------+-----------+------------+---------+  AASV           no                            0.19               +--------------+---------+------+-----------+------------+---------+  Summary:  Right:  - No evidence of deep vein thrombosis seen in the right lower extremity, from the common femoral through the popliteal veins.  - No evidence of superficial venous reflux seen in the right greater  saphenous vein.  - Venous reflux is noted in the right common femoral vein.  - Venous reflux is noted in the right sapheno-femoral junction.  - Venous reflux is noted in the right short saphenous vein.     Left:  - No evidence of deep vein thrombosis seen in the left lower extremity, from the common femoral through the popliteal veins.  - No evidence of superficial venous reflux seen in the left short  saphenous vein.  - Venous reflux is noted in the left greater saphenous vein in the thigh.  - Venous reflux is noted in the left greater saphenous vein in the calf.  - Venous reflux is noted in the left femoral vein.     Sindhu Nguyen Wiemers is a 55 y.o. female who presents with: bilateral foot and ankle swelling  -pt has palpable DP pedal pulses Do not feel that her rash she had on BLE was related to venous reflux or venous disease.  Could possibly have been an allergic reaction.  This has since resolved.   -pt does not have evidence of DVT.  Pt does deep venous reflux in the CFV as well as the SFJ and SSV mid calf on the right and she does have venous reflux in the deep femoral vein and GSV in the distal thigh down to the proximal calf.  She is not a candidate for laser ablation as the vein diameter does not meet qualifications.   -discussed with pt about wearing knee high 15-20 mmHg compression stockings and she was measured today. -discussed the importance of leg elevation and how to elevate properly - pt is advised to elevate their legs and a diagram is given to them to demonstrate to lay flat on their back with knees elevated and slightly bent  with their feet higher than her knees, which puts their feet higher than their heart for 15 minutes per day.   -pt is advised to continue as much walking as possible and avoid sitting or standing for long periods of time.  -pt maintains healthy weight currently.  She will continue exercising. -handout with recommendations given -pt will f/u as needed.    Leontine Locket, [facility] 02/12/2021 8:23 AM  Clinic MD:  Oneida Alar

## 2021-02-18 ENCOUNTER — Ambulatory Visit: Payer: No Typology Code available for payment source | Admitting: Cardiovascular Disease

## 2021-04-15 ENCOUNTER — Ambulatory Visit (INDEPENDENT_AMBULATORY_CARE_PROVIDER_SITE_OTHER): Payer: No Typology Code available for payment source | Admitting: Internal Medicine

## 2021-04-15 ENCOUNTER — Encounter: Payer: Self-pay | Admitting: Internal Medicine

## 2021-04-15 VITALS — BP 118/60 | HR 77 | Ht 65.75 in | Wt 125.0 lb

## 2021-04-15 DIAGNOSIS — J392 Other diseases of pharynx: Secondary | ICD-10-CM

## 2021-04-15 DIAGNOSIS — R142 Eructation: Secondary | ICD-10-CM | POA: Diagnosis not present

## 2021-04-15 DIAGNOSIS — J029 Acute pharyngitis, unspecified: Secondary | ICD-10-CM | POA: Diagnosis not present

## 2021-04-15 DIAGNOSIS — R1013 Epigastric pain: Secondary | ICD-10-CM

## 2021-04-15 NOTE — Patient Instructions (Signed)
The BravoT capsule test is a noninvasive test for evaluating heartburn or reflux symptoms related to gastroesophageal reflux disease (GERD). Damage caused by GERD can lead to more serious medical problems such as difficulty swallowing (dysphagia), narrowing of the esophagus (strictures), and Barrett's esophagus.  This test involves inserting a capsule the size of a long gel cap into the esophagus to measure the pH environment. Higher levels of pH in the esophagus indicate the presence of acid reflux. Your doctor will analyze results from the Beaver test to determine what is causing your symptoms and which treatment to prescribe for you.  Patients with pacemakers, cardiac defibrillators, or diagnosed gastrointestinal obstructions or strictures should inform your physician.   Throughout the test period, which lasts 48 to 72 hours, the Bravo capsule will measure the pH in your esophagus and transmit this information to the Bravo reflux recorder, a small device that you will wear on your belt or waistband as you would a mobile phone. The capsule communicates with the recorder wirelessly, meaning that no tube or wire remains in your nose or throat.  The Bravo capsule not only measures the degree of acidity during the test period but also how often stomach acid flows into the lower esophagus.  After the capsule has been placed, you can  go about your normal activities. Some patients can feel the presence of the capsule, some do not. You will also be given a diary to note when you have reflux symptoms, when you eat and drink, and when you sleep or lie down.  Once the test has been completed, you will return the recorder and diary to the [facility]. The data is then downloaded to a computer program, which provides a comprehensive report. Your GI doctor will analyze the information and your symptoms to determining if you have acid reflux.  A day or two after the test is completed, the disposable  capsule falls off the wall of your esophagus, harmlessly passes through your digestive tract, and is eliminated from your body through a bowel movement.  You should expect to receive the results of the Bravo study in approximately 2 weeks from returning the recorder.    For 7 days prior to your test do not take: Dexilant, Prevacid, Nexium, Protonix, Aciphex, Zegerid, Pantoprazole, Prilosec or Omeprazole.  For 7 days prior to your test, do not take: Reglan, Tagamet, Zantac, Axid or Pepcid.  You MAY use an antacid such as Rolaids or Tums up to 12 hours prior to your test.  You have been scheduled for an endoscopy. Please follow written instructions given to you at your visit today. If you use inhalers (even only as needed), please bring them with you on the day of your procedure.   I appreciate the opportunity to care for you. Silvano Rusk, MD, [facility]

## 2021-04-15 NOTE — Progress Notes (Signed)
Ann Calhoun 55 y.o. 03-Mar-1966 [id number]  Assessment & Plan:   Encounter Diagnoses  Name Primary?   Sore throat Yes   Pharyngeal edema    Belching    Dyspepsia     Belching is much better after speech therapy treatment with abdominal breathing techniques.  However she has these other issues now.  Regurgitation type complaints.  She wonders about "silent reflux".  I explained how I thought that the edema seen by ENT could be from reflux but it is only in association and there are other causes.  I told her it is my personal belief that this is over called by ENT physicians.  We have decided to perform an EGD with Bravo pH testing.  I will examine and consider biopsies of the mucosa as well.The risks and benefits as well as alternatives of endoscopic procedure(s) have been discussed and reviewed. All questions answered. The patient agrees to proceed.     Subjective:   Chief Complaint: Question silent reflux  HPI [name] returns, she had an evaluation for me last year with multiple studies, because of belching that started after she was on an autoimmune paleo diet in early 2021.  She had a lot of problems mostly with belching negative work-up with EGD, barium swallow/upper GI, esophageal manometry and pH impedance study.  She did have a positive lactulose hydrogen breath test with increased hydrogen but treatment of that did not make any difference.  She has been seeing naturopath and there is been some concern about "low acid".  She had her micro biome analyzed.  I had not been able to help her so I sent her to Dr. Matilde Bash in [facility].  She saw speech pathologist, Wynelle Cleveland, for diaphragmatic breathing treatment of her belching and that has been successful.  She is pleased with that.  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  Her problems seem to have started this time after taking Biocidin LSF a functional medicine treatment.  The question that had been raised in the theory postulated by her naturopath from [place] was that the acute onset of her belching could have been from an infection so they were trying to eliminate a possible infection.  1 night she had forgotten to take the bio Seiden during the day and she took it at night with her melatonin and magnesium and she had terrible burning and bubbling in her throat.  That was the perceived cause of this pharyngeal lesions for which she took some clarithromycin for and she said it disappeared.  She has been having some intermittent sore throat with this.  She saw naturopaths again and has been taking D GL and thinks that might of helped a little bit.  She recently did some sort of baking soda challenge where 1 would swallow baking soda and vinegar and depending upon your response may indicate low acid and her test indicated low stomach acid.  She is also tried marshmallow root slippery elm and aloe.  She continues to have an intermittent mild sore throat and occasional salty taste in her mouth and intermittent phlegm and bubbling in her throat.  She is very worried about this and concerned that she somehow damaged her esophagus with the Biocidin  Allergies  Allergen Reactions   Sulfa Antibiotics Hives and Itching   Current Meds  Medication Sig  AMBENONIUM CHLORIDE PO Take by mouth.   CYTOMEL 5 MCG tablet Take 2 tablets by mouth.   Magnesium 300 MG CAPS Take one in the AM and 2 in the PM   melatonin 3 MG TABS tablet Take 3 mg by mouth at bedtime.   NON FORMULARY COMPOUNDED MEDICATIONBIEST 5/5 (ESTRIOL/ESTRADIOL)-PROGESTERONE- TESTOSTERONE AVB (TOPI) 0.5(0.25-0.25)-20-0.'5MG'$ /ML   NON FORMULARY Tyrosint   VITAMIN D, CHOLECALCIFEROL, PO Take 1 tablet by mouth daily.   Past Medical History:  Diagnosis Date   Allergy    Anemia    Blood transfusion  without reported diagnosis    after c-section   Gastroparesis    peripartum   Hashimoto's disease    Hx of sessile serrated colonic polyp 11/16/2018   Hypothyroidism    Kidney stones    Nephrolithiasis    in her 20's   Osteopenia    Postpartum depression    Vitamin D deficiency    Past Surgical History:  Procedure Laterality Date   28 HOUR [place] STUDY N/A 06/25/2020   Procedure: 24 HOUR [place] STUDY;  Surgeon: Gatha Mayer, MD;  [facility]: [facility];  Service: Endoscopy;  Laterality: N/A;   BREAST BIOPSY Left 2015   benign   BREAST BIOPSY Left 02/01/2019   CESAREAN SECTION     uterus repair for perforation   COLONOSCOPY  multiple   ESOPHAGEAL MANOMETRY N/A 06/25/2020   Procedure: ESOPHAGEAL MANOMETRY (EM);  Surgeon: Gatha Mayer, MD;  Location: [facility];  Service: Endoscopy;  Laterality: N/A;   [place]   got septic afterwards.   Social History   Social History Narrative   Married to Dr. Nicki Reaper Krol   One daughter   family history includes Asthma in her brother; Atrial fibrillation in her mother; Breast cancer in her maternal grandmother; CVA in her paternal grandmother; Colon cancer (age of onset: 42) in her maternal grandfather; Colon polyps in her mother; Diabetes in her father; Esophageal cancer in her maternal grandmother; Glaucoma in her maternal grandmother; Hypothyroidism in her mother.   Review of Systems  As above Objective:   Physical Exam BP 118/60   Pulse 77   Ht 5' 5.75" (1.67 m)   Wt 125 lb (56.7 kg)   LMP 05/01/2014 (Within Months) Comment: Pt states that she has not had a period in 2 years, then had one ~35-40 days ago.  BMI 20.33 kg/m  Sl erythema in pharynx

## 2021-05-11 ENCOUNTER — Telehealth: Payer: Self-pay | Admitting: Internal Medicine

## 2021-05-11 NOTE — Telephone Encounter (Signed)
You can let her know I can take care of all of that tomorrow

## 2021-05-11 NOTE — Telephone Encounter (Signed)
Returned patient call.  Patient wanted to relay information from recent lab work that her Ferritin is 47, Iron sat is 11, and total iron is 39 which are all low.  Patient requests that active bleeding be evaluated during the upper endoscopy.  She also reports having intermittent pain after meals that "feels like a pulled muscle).  Patient requests to be tested for H. Pylori as well.  All patient questions answered and assured the patient will speak with Dr. Carlean Purl prior to the procedure and to mention these concerns with him.  Dr. Carlean Purl made aware of this conversation.

## 2021-05-11 NOTE — Telephone Encounter (Signed)
Patient has questions about procedure with Dr. Carlean Purl tomorrow. Please call her.

## 2021-05-12 ENCOUNTER — Telehealth: Payer: Self-pay | Admitting: Internal Medicine

## 2021-05-12 ENCOUNTER — Other Ambulatory Visit: Payer: Self-pay

## 2021-05-12 ENCOUNTER — Encounter: Payer: Self-pay | Admitting: Internal Medicine

## 2021-05-12 ENCOUNTER — Ambulatory Visit (AMBULATORY_SURGERY_CENTER): Payer: No Typology Code available for payment source | Admitting: Internal Medicine

## 2021-05-12 ENCOUNTER — Encounter: Payer: Self-pay | Admitting: *Deleted

## 2021-05-12 VITALS — BP 102/58 | HR 90 | Temp 98.0°F | Resp 16 | Ht 65.75 in | Wt 125.0 lb

## 2021-05-12 DIAGNOSIS — R1013 Epigastric pain: Secondary | ICD-10-CM

## 2021-05-12 DIAGNOSIS — J029 Acute pharyngitis, unspecified: Secondary | ICD-10-CM

## 2021-05-12 DIAGNOSIS — J392 Other diseases of pharynx: Secondary | ICD-10-CM

## 2021-05-12 DIAGNOSIS — R142 Eructation: Secondary | ICD-10-CM

## 2021-05-12 HISTORY — PX: UPPER GASTROINTESTINAL ENDOSCOPY: SHX188

## 2021-05-12 MED ORDER — SODIUM CHLORIDE 0.9 % IV SOLN: 500.0000 mL | Freq: Once | INTRAVENOUS | Status: DC

## 2021-05-12 NOTE — Progress Notes (Signed)
[id number] Robinul 0.1 mg IV given due large amount of secretions upon assessment.  MD made aware, vss

## 2021-05-12 NOTE — Progress Notes (Signed)
History and Physical Interval Note:  05/12/2021 8:37 AM  Ann Calhoun  has presented today for endoscopic procedure(s), with the diagnosis of  Encounter Diagnoses  Name Primary?   Sore throat Yes   Pharyngeal edema    Belching    Dyspepsia   .  The various methods of evaluation and treatment have been discussed with the patient and/or family. After consideration of risks, benefits and other options for treatment, the patient has consented to  the endoscopic procedure(s).   The patient's history has been reviewed, patient examined, no significant change in status, stable for endoscopic procedure(s).  I have reviewed the patient's chart and labs.  Questions were answered to the patient's satisfaction.     Gatha Mayer, MD, [facility]

## 2021-05-12 NOTE — Progress Notes (Signed)
[id number] Ephedrine 10 mg given IV due to low BP, MD updated.

## 2021-05-12 NOTE — Progress Notes (Signed)
Report given to PACU, vss 

## 2021-05-12 NOTE — Telephone Encounter (Signed)
Called patient with your recommendation and Suggested she follow a soft foods diet today. Advised that  you would call her this afternoon.

## 2021-05-12 NOTE — Telephone Encounter (Signed)
I spoke to the patient.  She is feeling okay she does have some foreign body sensation when she swallows.  She has not tried a meal again yet.  I have advised a liquid and soft diet tonight and advance as tolerated.  She is to call back if there are any severe problems.  She took a Xyzal I think it is fine to take an antihistamine while she has this study going though it could potentially alter things there is some concern about this potential nickel allergy crossover so we will treat to prevent that.

## 2021-05-12 NOTE — Progress Notes (Signed)
Called to room to assist during endoscopic procedure.  Patient ID and intended procedure confirmed with present staff. Received instructions for my participation in the procedure from the performing physician.   Capsule expiration date- 02/22/2022  Capsule ID number [id number]  LES measurement: 39 (capsule placed 6 cm above LES) 33  Time of implant: 905 am

## 2021-05-12 NOTE — Patient Instructions (Signed)
Post-op Bravo pH instructions Once you get home:  Eat normally and go about your daily routine/activities Limit drinking fluids or eating between meals Do not chew gum or eat hard candy DO NOT take any antacid or anti-reflux medications during the 48-hour monitoring time, unless instructed by your physician  Recording events: Events to be recorded are:  Record using event buttons on recorder and write on paper diary form Every time you eat or drink something (other than water) 2.   Periods of lying down/reclining 3.  Symptoms:  may include heartburn, regurgitation, chest pain, cough or specify if other.  A paper diary is also provided to record the times of your reflux symptoms and times for meals and when you lie down.  The recorder needs to remain within 3 feet (arms length) of you during the testing period (48 hours). If you should forget and move outside of a 3-foot radius of the receiver you may hear beeping and you will see a "C1" error in the display window on the top of the receiver.  Please pick up the receiver and hold close to you to re-establish the connection and the error message disappears.  You may take a bath/shower during the testing period, but the recorder must not get wet and must remain within 3 feet of you. Please leave the receiver outside of the shower or tub while bathing. The monitoring period will be for 48 hours after placement of the capsule.  At the end of the 48 hours, you will return the recorder, and your diary, to our [facility] front desk.  A nurse will meet you to collect the device and answer any questions you may have.  The device should turn off once the 48 hours is complete.   What to expect after placement of the capsule:  Some patients experience a vague sensation that something is in their esophagus or that they 'feel' the capsule when they swallow food.  Should you experience this, chewing food carefully or drinking liquids may  minimize this sensation.   After the test is complete, the disposable capsule will fall off the wall of your esophagus within 5-10 days and pass naturally with your bowel movement through the digestive tract.  Once the recorder is returned, your provider will review and interpret your recordings and contact you to discuss your results.  This may take up to two weeks.   DO NOT have an MRI for 30 days after your procedure to ensure the capsule is no longer inside your body  It is imperative that you return the recorder on _________________________ by 3:00pm.  Your information must be downloaded at this time to obtain your results.      YOU HAD AN ENDOSCOPIC PROCEDURE TODAY AT [facility]:   Refer to the procedure report that was given to you for any specific questions about what was found during the examination.  If the procedure report does not answer your questions, please call your gastroenterologist to clarify.  If you requested that your care partner not be given the details of your procedure findings, then the procedure report has been included in a sealed envelope for you to review at your convenience later.  YOU SHOULD EXPECT: Some feelings of bloating in the abdomen. Passage of more gas than usual.  Walking can help get rid of the air that was put into your GI tract during the procedure and reduce the bloating. If you had a lower endoscopy (  such as a colonoscopy or flexible sigmoidoscopy) you may notice spotting of blood in your stool or on the toilet paper. If you underwent a bowel prep for your procedure, you may not have a normal bowel movement for a few days.  Please Note:  You might notice some irritation and congestion in your nose or some drainage.  This is from the oxygen used during your procedure.  There is no need for concern and it should clear up in a day or so.  SYMPTOMS TO REPORT IMMEDIATELY:   Following upper endoscopy (EGD)  Vomiting of blood or coffee  ground material  New chest pain or pain under the shoulder blades  Painful or persistently difficult swallowing  New shortness of breath  Fever of 100F or higher  Black, tarry-looking stools  For urgent or emergent issues, a gastroenterologist can be reached at any hour by calling [phone]. Do not use MyChart messaging for urgent concerns.    DIET:  We do recommend a small meal at first, but then you may proceed to your regular diet.  Drink plenty of fluids but you should avoid alcoholic beverages for 24 hours.  ACTIVITY:  You should plan to take it easy for the rest of today and you should NOT DRIVE or use heavy machinery until tomorrow (because of the sedation medicines used during the test).    FOLLOW UP: Our staff will call the number listed on your records 48-72 hours following your procedure to check on you and address any questions or concerns that you may have regarding the information given to you following your procedure. If we do not reach you, we will leave a message.  We will attempt to reach you two times.  During this call, we will ask if you have developed any symptoms of COVID 19. If you develop any symptoms (ie: fever, flu-like symptoms, shortness of breath, cough etc.) before then, please call [phone].  If you test positive for Covid 19 in the 2 weeks post procedure, please call and report this information to [place].    If any biopsies were taken you will be contacted by phone or by letter within the next 1-3 weeks.  Please call us at [phone] if you have not heard about the biopsies in 3 weeks.    SIGNATURES/CONFIDENTIALITY: You and/or your care partner have signed paperwork which will be entered into your electronic medical record.  These signatures attest to the fact that that the information above on your After Visit Summary has been reviewed and is understood.  Full responsibility of the confidentiality of this discharge information lies with you and/or  your care-partner.      CONTINUE PRESENT MEDICATIONS.

## 2021-05-12 NOTE — Telephone Encounter (Signed)
Patient calling to inform after getting home she watched the video as instructed. The video mentioned if you have  a nickel allergy please tell your GI provider. 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 Please advise, thank you

## 2021-05-12 NOTE — Op Note (Signed)
[place] Patient Name: Ann Calhoun Procedure Date: 05/12/2021 8:35 AM MRN: [id number] Endoscopist: Gatha Mayer , MD Age: 55 Referring MD:  Date of Birth: 08-27-1966 Gender: Female Account #: [id number] Procedure:                Upper GI endoscopy Indications:              Dyspepsia, Sore throat Medicines:                Propofol per Anesthesia, Monitored Anesthesia Care Procedure:                Pre-Anesthesia Assessment:                           - Prior to the procedure, a History and Physical                            was performed, and patient medications and                            allergies were reviewed. The patient's tolerance of                            previous anesthesia was also reviewed. The risks                            and benefits of the procedure and the sedation                            options and risks were discussed with the patient.                            All questions were answered, and informed consent                            was obtained. Prior Anticoagulants: The patient has                            taken no previous anticoagulant or antiplatelet                            agents. ASA Grade Assessment: II - A patient with                            mild systemic disease. After reviewing the risks                            and benefits, the patient was deemed in                            satisfactory condition to undergo the procedure.                           After obtaining informed consent, the endoscope was  passed under direct vision. Throughout the                            procedure, the patient's blood pressure, pulse, and                            oxygen saturations were monitored continuously. The                            GIF [id number] [id number] was introduced through the                            mouth, and advanced to the second part of duodenum.                            The  upper GI endoscopy was accomplished without                            difficulty. The patient tolerated the procedure                            well. Scope In: Scope Out: Findings:                 The examined esophagus was normal.                           The Z-line was regular and was found 39 cm from the                            incisors.                           The gastroesophageal flap valve was visualized                            endoscopically and classified as Hill Grade II                            (fold present, opens with respiration).                           The entire examined stomach was normal. Biopsies                            were taken with a cold forceps for Helicobacter                            pylori testing using CLOtest. Verification of                            patient identification for the specimen was done.                            Estimated blood loss was minimal.  The examined duodenum was normal.                           The cardia and gastric fundus were normal on                            retroflexion.                           The BRAVO capsule with delivery system was                            introduced through the mouth and advanced into the                            esophagus, such that the BRAVO pH capsule was                            positioned 33 cm from the incisors, which was 6 cm                            proximal to the GE junction. The BRAVO pH capsule                            was then deployed and attached to the esophageal                            mucosa. The delivery system was then withdrawn.                            Endoscopy was utilized for probe placement and                            diagnostic evaluation. Complications:            No immediate complications. Estimated Blood Loss:     Estimated blood loss was minimal. Impression:               - Normal esophagus.                            - Z-line regular, 39 cm from the incisors.                           - Gastroesophageal flap valve classified as Hill                            Grade II (fold present, opens with respiration).                           - Normal stomach. Biopsied.                           - Normal examined duodenum.                           -  The BRAVO pH capsule was deployed. Recommendation:           - Patient has a contact number available for                            emergencies. The signs and symptoms of potential                            delayed complications were discussed with the                            patient. Return to normal activities tomorrow.                            Written discharge instructions were provided to the                            patient.                           - Resume previous diet.                           - Continue present medications.                           - Await pathology results.                           - Await bravo results Gatha Mayer, MD 05/12/2021 9:10:40 AM This report has been signed electronically.

## 2021-05-12 NOTE — Progress Notes (Signed)
Pt's states no medical or surgical changes since previsit or office visit.  ° °Vitals CW °

## 2021-05-12 NOTE — Telephone Encounter (Signed)
Nickel allergy is skin rash with certain nickel.  containing jewelry. She also states that after eating her first meal it felt as though food was getting stuck and she almost vomited. Please advise.

## 2021-05-12 NOTE — Telephone Encounter (Signed)
Tell her nothing seriously bad should result from this issue.  I will call her later.  Very reasonable to take a Claritin or Zyrtec

## 2021-05-13 LAB — HELICOBACTER PYLORI SCREEN-BIOPSY: UREASE: NEGATIVE

## 2021-05-14 ENCOUNTER — Telehealth: Payer: Self-pay | Admitting: *Deleted

## 2021-05-14 ENCOUNTER — Telehealth: Payer: Self-pay

## 2021-05-14 NOTE — Telephone Encounter (Signed)
  Follow up Call-  Call back number 05/12/2021 02/28/2020 11/09/2018  Post procedure Call Back phone  # [phone]  Permission to leave phone message Yes Yes Yes  Some recent data might be hidden     Patient questions:  Do you have a fever, pain , or abdominal swelling? No. Pain Score  0 *  Have you tolerated food without any problems? Yes.    Have you been able to return to your normal activities? Yes.    Do you have any questions about your discharge instructions: Diet   No. Medications  No. Follow up visit  No.  Do you have questions or concerns about your Care? Yes. -pt asking when to expect the capsule to fall off because she still feels it. Instructed pt that there was not a definitive time frame but that hopefully within the next week it should fall off.   Actions: * If pain score is 4 or above: No action needed, pain <4.  Have you developed a fever since your procedure? no  2.   Have you had an respiratory symptoms (SOB or cough) since your procedure? no  3.   Have you tested positive for COVID 19 since your procedure no  4.   Have you had any family members/close contacts diagnosed with the COVID 19 since your procedure?  no   If yes to any of these questions please route to Joylene John, RN and Joella Prince, RN

## 2021-05-14 NOTE — Telephone Encounter (Signed)
NO ANSWER, MESSAGE LEFT FOR PATIENT. 

## 2021-05-19 ENCOUNTER — Telehealth: Payer: Self-pay | Admitting: Internal Medicine

## 2021-05-19 NOTE — Telephone Encounter (Signed)
Communicated that the Bravo pH probe results were normal GERD is not linked to her symptoms of sore throat.  I will communicate through [place] a few more details of the study and copy Dr. Matilde Bash on the study.

## 2021-06-10 ENCOUNTER — Ambulatory Visit: Payer: No Typology Code available for payment source | Admitting: Hematology & Oncology

## 2021-06-10 ENCOUNTER — Other Ambulatory Visit: Payer: No Typology Code available for payment source

## 2021-06-12 ENCOUNTER — Other Ambulatory Visit: Payer: No Typology Code available for payment source

## 2021-06-12 ENCOUNTER — Ambulatory Visit: Payer: No Typology Code available for payment source | Admitting: Hematology & Oncology

## 2021-06-15 ENCOUNTER — Inpatient Hospital Stay: Payer: No Typology Code available for payment source | Attending: Hematology & Oncology

## 2021-06-15 ENCOUNTER — Other Ambulatory Visit: Payer: Self-pay

## 2021-06-15 ENCOUNTER — Encounter: Payer: Self-pay | Admitting: Hematology & Oncology

## 2021-06-15 ENCOUNTER — Inpatient Hospital Stay (HOSPITAL_BASED_OUTPATIENT_CLINIC_OR_DEPARTMENT_OTHER): Payer: No Typology Code available for payment source | Admitting: Hematology & Oncology

## 2021-06-15 VITALS — BP 98/48 | HR 89 | Temp 98.3°F | Resp 20 | Ht 66.0 in | Wt 124.4 lb

## 2021-06-15 DIAGNOSIS — Z803 Family history of malignant neoplasm of breast: Secondary | ICD-10-CM | POA: Diagnosis not present

## 2021-06-15 DIAGNOSIS — D509 Iron deficiency anemia, unspecified: Secondary | ICD-10-CM

## 2021-06-15 DIAGNOSIS — D508 Other iron deficiency anemias: Secondary | ICD-10-CM

## 2021-06-15 DIAGNOSIS — R202 Paresthesia of skin: Secondary | ICD-10-CM

## 2021-06-15 DIAGNOSIS — Z87891 Personal history of nicotine dependence: Secondary | ICD-10-CM | POA: Insufficient documentation

## 2021-06-15 DIAGNOSIS — Z808 Family history of malignant neoplasm of other organs or systems: Secondary | ICD-10-CM | POA: Insufficient documentation

## 2021-06-15 DIAGNOSIS — K6389 Other specified diseases of intestine: Secondary | ICD-10-CM

## 2021-06-15 DIAGNOSIS — K909 Intestinal malabsorption, unspecified: Secondary | ICD-10-CM

## 2021-06-15 HISTORY — DX: Intestinal malabsorption, unspecified: K90.9

## 2021-06-15 HISTORY — DX: Iron deficiency anemia, unspecified: D50.9

## 2021-06-15 LAB — CBC WITH DIFFERENTIAL (CANCER CENTER ONLY)
Abs Immature Granulocytes: 0.07 10*3/uL (ref 0.00–0.07)
Basophils Absolute: 0.1 10*3/uL (ref 0.0–0.1)
Basophils Relative: 1 %
Eosinophils Absolute: 0.1 10*3/uL (ref 0.0–0.5)
Eosinophils Relative: 2 %
HCT: 39.5 % (ref 36.0–46.0)
Hemoglobin: 12.4 g/dL (ref 12.0–15.0)
Immature Granulocytes: 1 %
Lymphocytes Relative: 29 %
Lymphs Abs: 1.8 10*3/uL (ref 0.7–4.0)
MCH: 27.1 pg (ref 26.0–34.0)
MCHC: 31.4 g/dL (ref 30.0–36.0)
MCV: 86.2 fL (ref 80.0–100.0)
Monocytes Absolute: 0.5 10*3/uL (ref 0.1–1.0)
Monocytes Relative: 8 %
Neutro Abs: 3.8 10*3/uL (ref 1.7–7.7)
Neutrophils Relative %: 59 %
Platelet Count: 275 10*3/uL (ref 150–400)
RBC: 4.58 MIL/uL (ref 3.87–5.11)
RDW: 12.6 % (ref 11.5–15.5)
WBC Count: 6.4 10*3/uL (ref 4.0–10.5)
nRBC: 0 % (ref 0.0–0.2)

## 2021-06-15 LAB — CMP (CANCER CENTER ONLY)
ALT: 20 U/L (ref 0–44)
AST: 18 U/L (ref 15–41)
Albumin: 4.4 g/dL (ref 3.5–5.0)
Alkaline Phosphatase: 86 U/L (ref 38–126)
Anion gap: 6 (ref 5–15)
BUN: 20 mg/dL (ref 6–20)
CO2: 31 mmol/L (ref 22–32)
Calcium: 9.9 mg/dL (ref 8.9–10.3)
Chloride: 103 mmol/L (ref 98–111)
Creatinine: 0.65 mg/dL (ref 0.44–1.00)
GFR, Estimated: >60 mL/min (ref >60)
Glucose, Bld: 93 mg/dL (ref 70–99)
Potassium: 4 mmol/L (ref 3.5–5.1)
Sodium: 140 mmol/L (ref 135–145)
Total Bilirubin: 0.4 mg/dL (ref 0.3–1.2)
Total Protein: 7.2 g/dL (ref 6.5–8.1)

## 2021-06-15 LAB — RETICULOCYTES
Immature Retic Fract: 7.8 % (ref 2.3–15.9)
RBC.: 4.53 MIL/uL (ref 3.87–5.11)
Retic Count, Absolute: 54.4 10*3/uL (ref 19.0–186.0)
Retic Ct Pct: 1.2 % (ref 0.4–3.1)

## 2021-06-15 LAB — VITAMIN B12: Vitamin B-12: 410 pg/mL (ref 180–914)

## 2021-06-15 LAB — SAVE SMEAR(SSMR), FOR PROVIDER SLIDE REVIEW

## 2021-06-15 NOTE — Progress Notes (Signed)
Referral MD  Reason for Referral: Iron deficiency anemia-likely malabsorption  Chief Complaint  Patient presents with   New Patient (Initial Visit)    Low iron,anema  : My iron has been low.  I am losing my hair.  My thyroid has been off.  HPI: Ann Calhoun is a very charming 55 year old white female.  She is the wife of our esteemed urologist, Scott Finklea.  She has a daughter who is in [employer] in [place].  She is seen by multiple doctors.  She has Hashimoto's thyroiditis.  She has hypothyroidism.  She has history of gastroparesis.  She has been having problems with fatigue.  She has not had a lot of energy.  She has been very proactive with her health.  She actually brought in labs that she had done.  It showed that she did have low iron saturation.  She is followed with Dr. Dian Queen.  Dr. Helane Rima wished for her to be seen by hematology.  As such, she was, referred to [facility] for an evaluation.  She has had colonoscopy.  I think this was a couple years ago.  This was back in March 2020.  A 8 mm polyp was found in the cecum.  She had upper endoscopy done in September of this year.  Everything turned out okay.  There is no obvious pathology that was found.  She has had no problems with bleeding.  She has not had her monthly cycles for many years.  She does not smoke.  I think she may have wine on occasion.   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  She says she has been losing her hair.  She has had problems with her thyroid.  She says that both of these could be from her iron not being adequate.  Much sure she is taking any oral iron.  She may be on some vitamin C.  She, again, had the extensive list of labs that she had done.  She had vitamins that were done.  She had iron studies that were done.  She electrolytes.  I must say was very extensive.  She has had no rashes.  There has been no weight loss or weight  gain.  She has had no obvious change in bowel or bladder habits.  She has had no issues with COVID.  There is no cough.  She has had no mouth sores.  Overall, I would have to say her performance status is probably ECOG 1.  Past Medical History:  Diagnosis Date   Allergy    Anemia    Blood transfusion without reported diagnosis    after c-section   Gastroparesis    peripartum   Hashimoto's disease    Hx of sessile serrated colonic polyp 11/16/2018   Hypothyroidism    Kidney stones    Nephrolithiasis    in her 20's   Osteopenia    Postpartum depression    Vitamin D deficiency   :   Past Surgical History:  Procedure Laterality Date   53 HOUR [place] STUDY N/A 06/25/2020   Procedure: 24 HOUR [place] STUDY;  Surgeon: Gatha Mayer, MD;  Location: [facility];  Service: Endoscopy;  Laterality: N/A;   BREAST BIOPSY Left 2015   benign   BREAST BIOPSY Left 02/01/2019   CESAREAN SECTION     uterus repair for perforation   COLONOSCOPY  multiple   ESOPHAGEAL MANOMETRY N/A 06/25/2020  Procedure: ESOPHAGEAL MANOMETRY (EM);  Surgeon: Gatha Mayer, MD;  Location: [facility];  Service: Endoscopy;  Laterality: N/A;   [place]   got septic afterwards.   UPPER GASTROINTESTINAL ENDOSCOPY  05/12/2021   Bravo capsule placement  :   Current Outpatient Medications:    Ascorbic Acid (VITAMIN C WITH ROSE HIPS) 1000 MG tablet, Take 1,000 mg by mouth daily., Disp: , Rfl:    CYTOMEL 5 MCG tablet, Take 2 tablets by mouth daily., Disp: , Rfl:    MAGNESIUM PO, 340 mg daily. Take one in the AM and 2 in the PM, Disp: , Rfl:    melatonin 3 MG TABS tablet, Take 3 mg by mouth at bedtime., Disp: , Rfl:    NON FORMULARY, 50 mg daily. Tyrosint, Disp: , Rfl:    NONFORMULARY OR COMPOUNDED ITEM, Place 60 mg vaginally at bedtime. Progesterone cream, Disp: , Rfl:    VITAMIN D, CHOLECALCIFEROL, PO, Take 1 tablet by mouth daily. Takes 2500 IU daily., Disp: , Rfl:    VIVELLE-DOT 0.0375  MG/24HR, 1 patch 2 (two) times a week., Disp: , Rfl:    Zinc Gluconate 30 MG TABS, daily., Disp: , Rfl: :  :   Allergies  Allergen Reactions   Sulfa Antibiotics Hives and Itching   Nickel Rash  :   Family History  Problem Relation Age of Onset   Colon polyps Mother    Atrial fibrillation Mother    Hypothyroidism Mother    Colon cancer Maternal Grandfather 85   Breast cancer Maternal Grandmother    Esophageal cancer Maternal Grandmother    Glaucoma Maternal Grandmother    Diabetes Father    Asthma Brother    CVA Paternal Grandmother    Rectal cancer Neg Hx    Stomach cancer Neg Hx   :   Social History   Socioeconomic History   Marital status: Married    Spouse name: Not on file   Number of children: Not on file   Years of education: Not on file   Highest education level: Not on file  Occupational History   Not on file  Tobacco Use   Smoking status: Former    Types: Cigarettes    Quit date: 12/30/1995    Years since quitting: 25.4   Smokeless tobacco: Never   Tobacco comments:    occasional at that time  Vaping Use   Vaping Use: Never used  Substance and Sexual Activity   Alcohol use: Yes    Alcohol/week: 4.0 standard drinks    Types: 4 Glasses of wine per week    Comment: not  currently   Drug use: No   Sexual activity: Not on file  Other Topics Concern   Not on file  Social History Narrative   Married to Dr. Bjorn Loser   One daughter   Social Determinants of Health   Financial Resource Strain: Not on file  Food Insecurity: Not on file  Transportation Needs: Not on file  Physical Activity: Not on file  Stress: Not on file  Social Connections: Not on file  Intimate Partner Violence: Not on file  :  Review of Systems  Constitutional:  Positive for malaise/fatigue.  HENT: Negative.    Eyes: Negative.   Respiratory: Negative.    Cardiovascular: Negative.   Gastrointestinal: Negative.   Genitourinary: Negative.   Musculoskeletal:  Negative.   Skin: Negative.   Neurological: Negative.   Endo/Heme/Allergies: Negative.   Psychiatric/Behavioral: Negative.  Exam: @IPVITALS @ This is a well-developed and well-nourished white female in no obvious distress.  Vital signs show temperature of 98.3.  Pulse 89.  Blood pressure 98/48.  Weight is 124 pounds.  Head and neck exam shows no ocular or oral lesions.  She has no adenopathy in the neck.  Thyroid is not palpable.  Lungs are clear bilaterally.  Cardiac exam regular rate and rhythm with no murmurs, rubs or bruits.  Abdomen is soft.  She has good bowel sounds.  There is no guarding or rebound tenderness.  Liver edge might be palpable at the right costal margin.  There is no splenomegaly.  Back exam shows no tenderness over the spine, ribs or hips.  Extremities shows no clubbing, cyanosis or edema.  She has good range of motion of her joints.  She has good pulses in her distal extremities.  She has no erythema or warmth of her joints.  Skin exam shows no rashes, ecchymosis or petechia.  Neurological exam shows no focal neurological deficits.   Recent Labs    06/15/21 1354  WBC 6.4  HGB 12.4  HCT 39.5  PLT 275    Recent Labs    06/15/21 1354  NA 140  K 4.0  CL 103  CO2 31  GLUCOSE 93  BUN 20  CREATININE 0.65  CALCIUM 9.9    Blood smear review: There is some slight poikilocytosis and anisocytosis.  She has some microcytic red blood cells.  I do not see any nucleated red blood cells.  There is no rouleaux formation.  She has no target cells.  I see no schistocytes or spherocytes.  White blood cells appear normal in morphology and maturation.  I do not see any immature myeloid or lymphoid forms.  There are no hypersegmented polys.  Platelets are adequate in number and size.  Platelets are well granulated.    Pathology: None    Assessment and Plan: Ms. Weidmann is a very nice 55 year old white female.  She has some systemic health issues.  The question is whether  or not she has iron deficiency.  Her blood smear certainly looks like there may be some iron deficiency.  She had a lab work done that showed she has low iron saturation.  Her ferritin was okay but this really could be a measurement of other issues, such as inflammation.  We will have to see what the iron saturation is.  Again, it would not surprise me if this was on the low side.  Her MCV is a little bit on the lower side.  I do not see anything else that might be going on.  I do not see a thing that would look like a hematologic malignancy.  She does not need a bone marrow biopsy.  I am not sure if I could feel her liver edge.  I do think a ultrasound of the abdomen would not be a bad idea.  We will see about getting this set up for her.  It was fun talking to her.  She is incredibly eloquent.  She certainly is very well educated as to what is going on.  She is on a lot of research.  We will give her a call once we get the iron results back and then see how we can try to help her and make her feel better.

## 2021-06-16 LAB — IRON AND TIBC
Iron: 46 ug/dL (ref 41–142)
Saturation Ratios: 13 % — ABNORMAL LOW (ref 21–57)
TIBC: 359 ug/dL (ref 236–444)
UIBC: 313 ug/dL (ref 120–384)

## 2021-06-16 LAB — TSH: TSH: 0.306 u[IU]/mL — ABNORMAL LOW (ref 0.308–3.960)

## 2021-06-16 LAB — FERRITIN: Ferritin: 59 ng/mL (ref 11–307)

## 2021-06-16 NOTE — Progress Notes (Signed)
Awaiting PA.

## 2021-06-17 LAB — ERYTHROPOIETIN: Erythropoietin: 13.4 m[IU]/mL (ref 2.6–18.5)

## 2021-06-18 ENCOUNTER — Telehealth: Payer: Self-pay | Admitting: *Deleted

## 2021-06-18 ENCOUNTER — Other Ambulatory Visit: Payer: Self-pay | Admitting: Family

## 2021-06-18 NOTE — Telephone Encounter (Signed)
Pt notified per order of Dr. Marin Olp "that the iron is on the low side.  Her iron saturation is low at 13%.  Please set her up with IV iron.  Thanks.  Pete"  Pt informed that scheduling will call her once PA has been obtained.  Pt is appreciative of call and has no questions or concerns at this time.

## 2021-06-18 NOTE — Telephone Encounter (Signed)
-----   Message from Volanda Napoleon, MD sent at 06/16/2021 12:36 PM EDT ----- Please call her and tell her that the iron is on the low side.  Her iron saturation is low at 13%.  Please set her up with IV iron.  Thanks.  Laurey Arrow

## 2021-06-22 ENCOUNTER — Ambulatory Visit (HOSPITAL_COMMUNITY)
Admission: RE | Admit: 2021-06-22 | Discharge: 2021-06-22 | Disposition: A | Discharge status: Home / Self Care | Payer: No Typology Code available for payment source | Source: Ambulatory Visit | Attending: Hematology & Oncology | Admitting: Hematology & Oncology

## 2021-06-22 DIAGNOSIS — K6389 Other specified diseases of intestine: Secondary | ICD-10-CM | POA: Diagnosis present

## 2021-06-23 ENCOUNTER — Telehealth: Payer: Self-pay

## 2021-06-23 ENCOUNTER — Other Ambulatory Visit: Payer: Self-pay | Admitting: Family

## 2021-06-23 NOTE — Telephone Encounter (Signed)
Responded to pt via MyChart

## 2021-06-23 NOTE — Telephone Encounter (Signed)
-----   Message from Volanda Napoleon, MD sent at 06/23/2021 10:31 AM EDT ----- Call - the [place] does not show any problems with the liver!!!  Laurey Arrow

## 2021-06-24 ENCOUNTER — Ambulatory Visit: Payer: No Typology Code available for payment source

## 2021-06-24 ENCOUNTER — Telehealth: Payer: Self-pay | Admitting: *Deleted

## 2021-06-24 NOTE — Telephone Encounter (Signed)
Per scheduling message Sarah - called and gave upcoming appointments - confirmed (1) dose of IV Iron 

## 2021-06-26 ENCOUNTER — Telehealth: Payer: Self-pay | Admitting: *Deleted

## 2021-06-26 NOTE — Telephone Encounter (Signed)
Per scheduling message Ann Calhoun - called and gave upcoming appointment - confirmed - (1) dose of IV Iron (MONOFERRIC)

## 2021-06-30 ENCOUNTER — Other Ambulatory Visit: Payer: Self-pay

## 2021-06-30 ENCOUNTER — Inpatient Hospital Stay: Payer: PRIVATE HEALTH INSURANCE | Attending: Hematology & Oncology

## 2021-06-30 VITALS — BP 89/50 | HR 77 | Temp 98.2°F | Resp 16

## 2021-06-30 DIAGNOSIS — K909 Intestinal malabsorption, unspecified: Secondary | ICD-10-CM

## 2021-06-30 DIAGNOSIS — D509 Iron deficiency anemia, unspecified: Secondary | ICD-10-CM | POA: Diagnosis not present

## 2021-06-30 DIAGNOSIS — D500 Iron deficiency anemia secondary to blood loss (chronic): Secondary | ICD-10-CM

## 2021-06-30 MED ORDER — ACETAMINOPHEN 325 MG PO TABS
650.0000 mg | ORAL_TABLET | Freq: Once | ORAL | Status: AC
  Administered 2021-06-30: 650 mg via ORAL
  Filled 2021-06-30: qty 2

## 2021-06-30 MED ORDER — SODIUM CHLORIDE 0.9 % IV SOLN
1000.0000 mg | Freq: Once | INTRAVENOUS | Status: AC
  Administered 2021-06-30: 1000 mg via INTRAVENOUS
  Filled 2021-06-30: qty 10

## 2021-06-30 MED ORDER — SODIUM CHLORIDE 0.9 % IV SOLN: Freq: Once | INTRAVENOUS | Status: AC

## 2021-06-30 NOTE — Patient Instructions (Signed)

## 2021-07-01 IMAGING — RF DG UGI W/ HIGH DENSITY W/O KUB
12 of 19 series · 12 of 24 positions shown · non-contrast
Comparison: None

CLINICAL DATA: Belching and chest pressure.

EXAM:
UPPER GI SERIES WITH KUB
TECHNIQUE: After obtaining a scout radiograph a routine upper GI series was
performed using thin and high density barium as well as barium
tablet.
FLUOROSCOPY TIME:  Fluoroscopy Time:  3 minutes 42 seconds
Radiation Exposure Index (if provided by the fluoroscopic device):
9.4 mGy
Number of Acquired Spot Images: 0

[Series 2: cp_standard · 0.35mm/px · 1 of 23 frames shown (1 of 12)]
[frame 12/23]
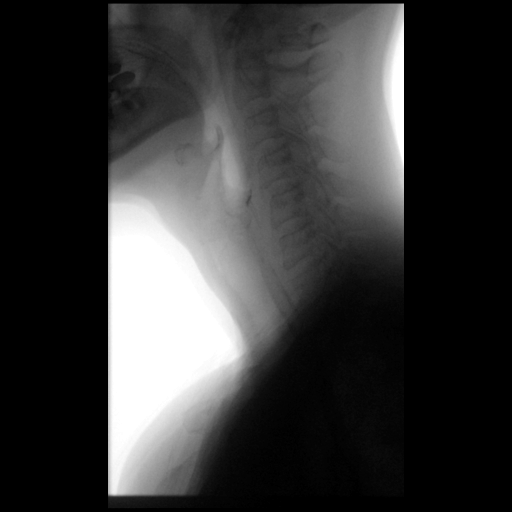

[Series 4: cp_standard · 0.35mm/px · 1 of 22 frames shown (2 of 12)]
[frame 4/22]
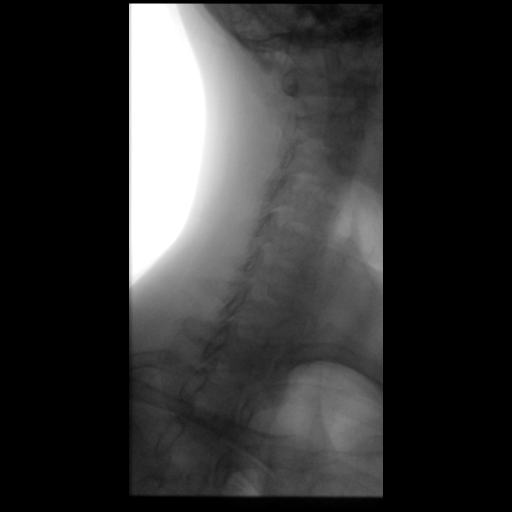

[Series 5: cp_standard · 0.35mm/px · 1 of 2 frames shown (3 of 12)]
[frame 2/2]
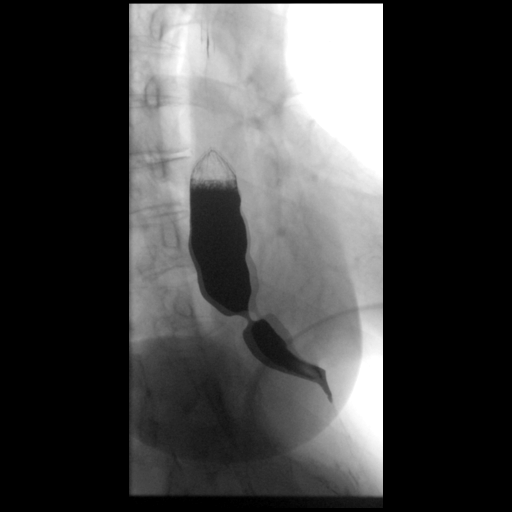

[Series 7: cp_standard · 0.35mm/px · 1 of 31 frames shown (4 of 12)]
[frame 5/31]
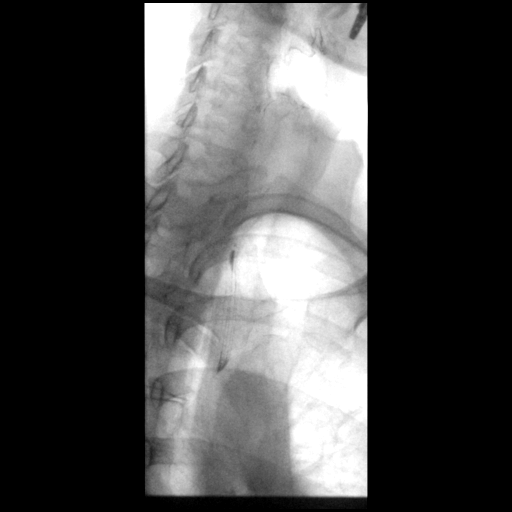

[Series 8: cp_standard · 0.36mm/px · 1 of 28 frames shown (5 of 12)]
[frame 8/28]
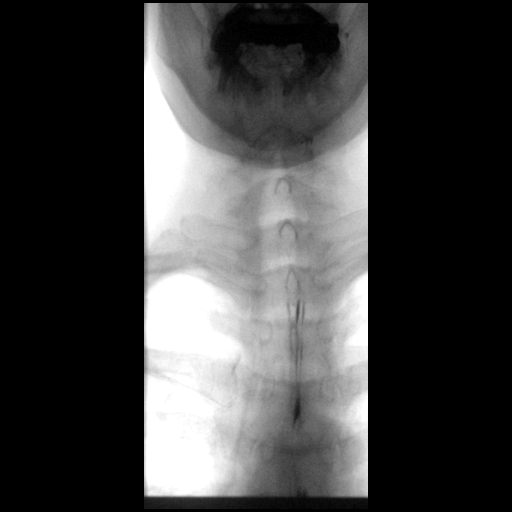

[Series 9: cp_standard · 0.36mm/px · 1 of 18 frames shown (6 of 12)]
[frame 3/18]
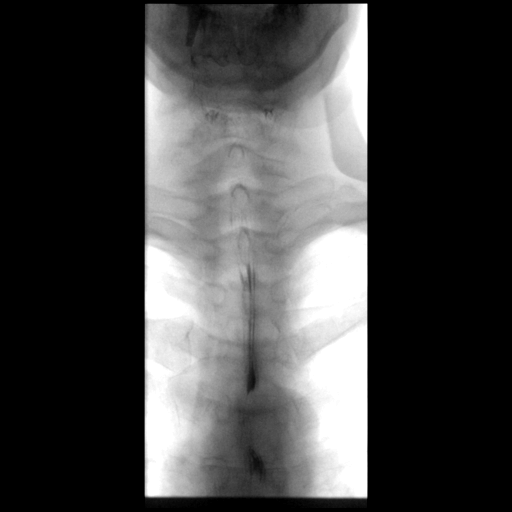

[Series 11: cp_standard · 0.18mm/px · 1 of 1 slices shown (7 of 12)]
[im 1/1]
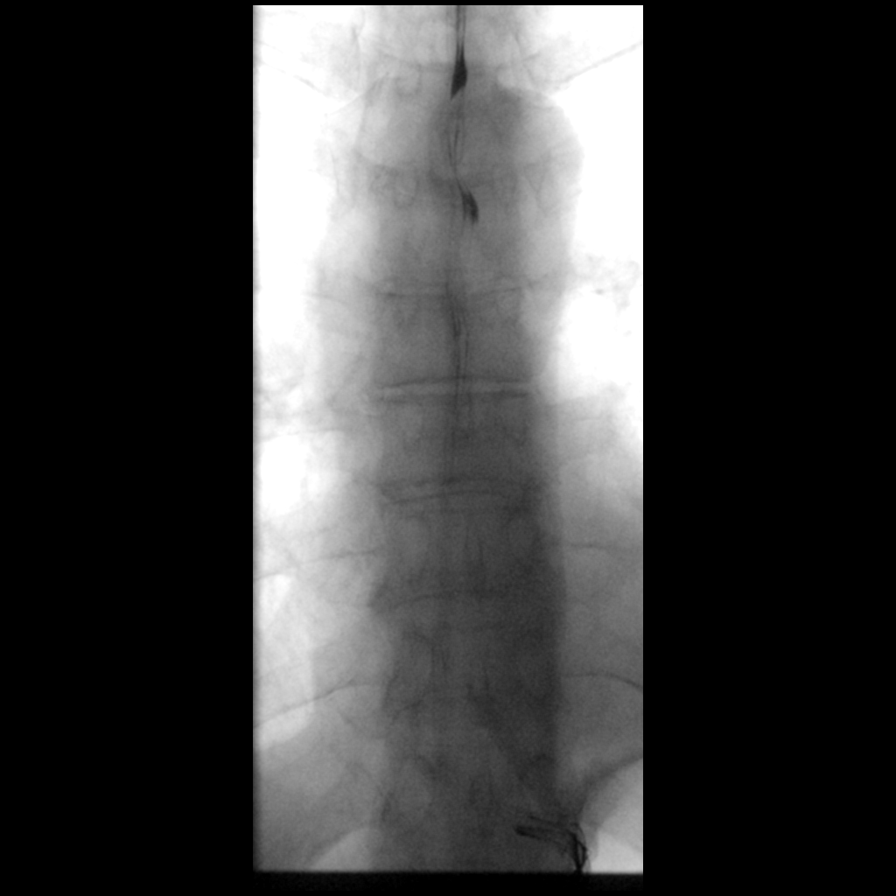

[Series 14: cp_standard · 0.38mm/px · 1 of 6 frames shown (8 of 12)]
[frame 1/6]
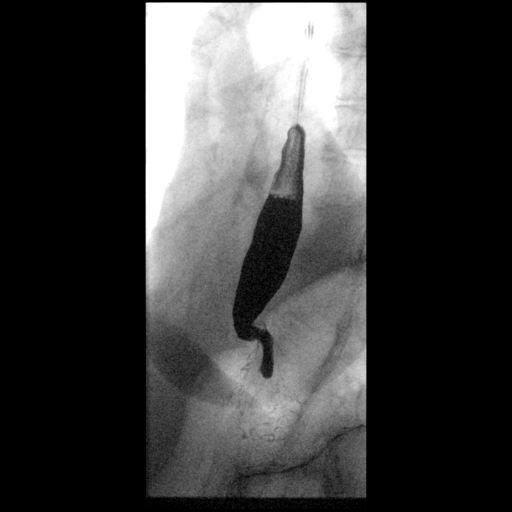

[Series 15: cp_standard · 0.38mm/px · 1 of 3 frames shown (9 of 12)]
[frame 3/3]
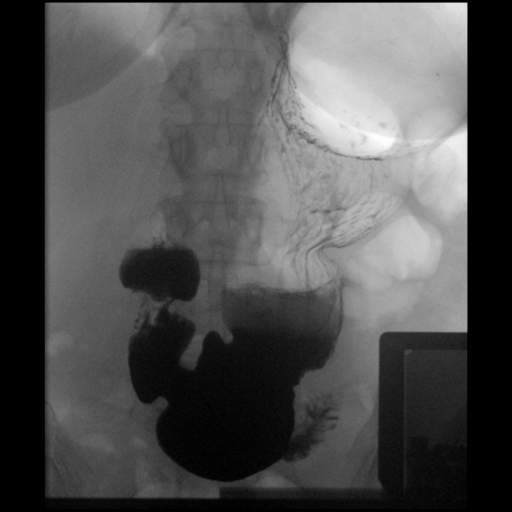

[Series 19: cp_standard · 0.28mm/px · 1 of 1 slices shown (10 of 12)]
[im 1/1]
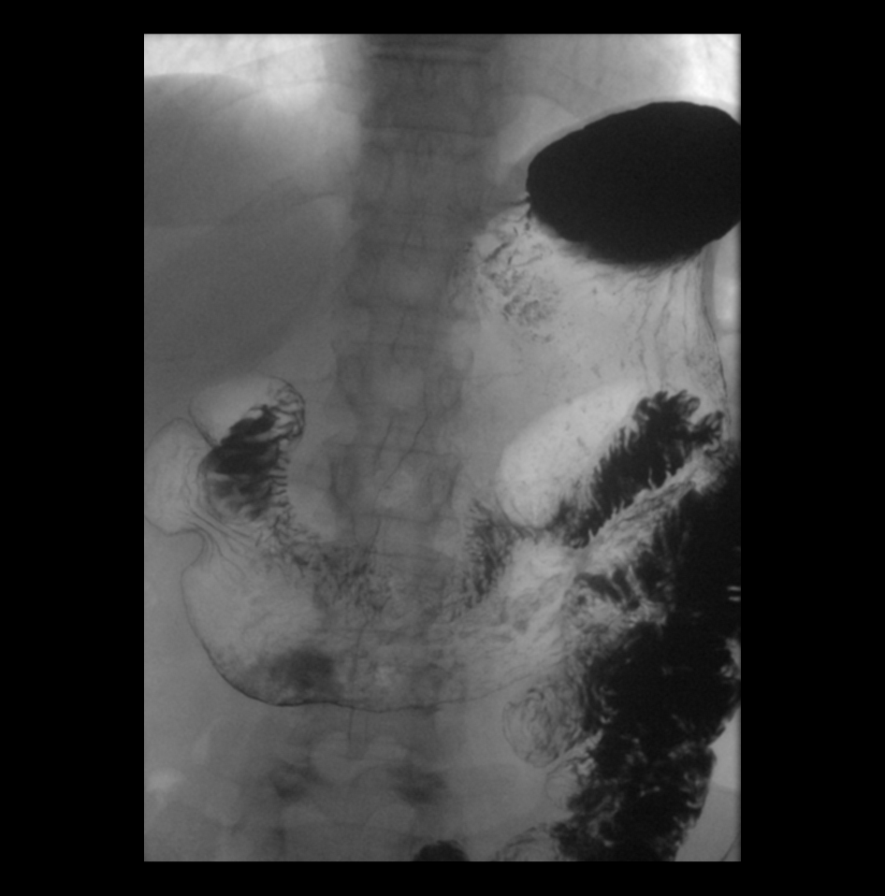

[Series 22: cp_standard · 0.37mm/px · 1 of 8 frames shown (11 of 12)]
[frame 5/8]
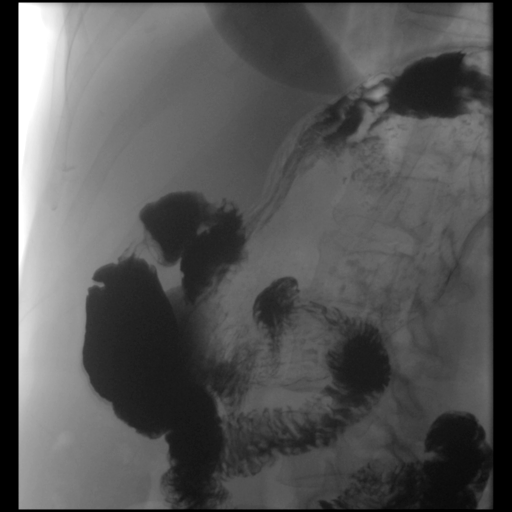

[Series 24: cp_standard · 0.19mm/px · 1 of 1 slices shown (12 of 12)]
[im 1/1]
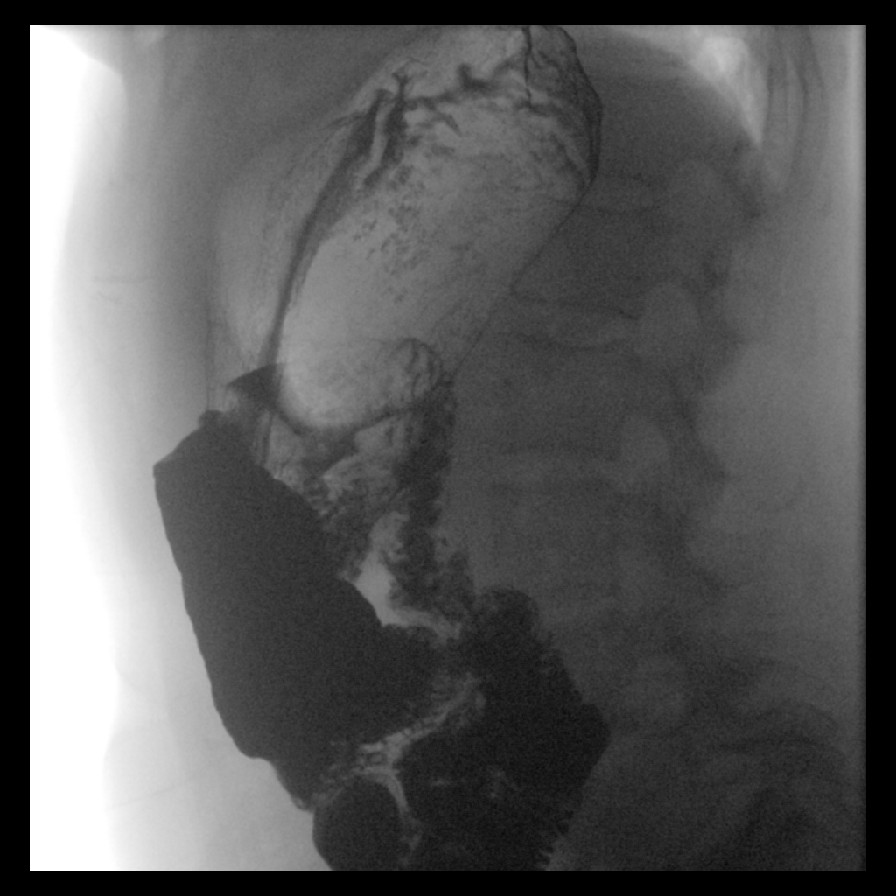

[12 of 24 positions shown; findings below may reference images not displayed]

FINDINGS: Scout radiograph the abdomen with normal bowel gas pattern. Stool in
the distal colon and in the ascending colon.

Lung bases are clear.

No acute musculoskeletal process on limited assessment. Thin barium
was administered showing no gross swallow dysfunction.

Effervescent crystals within administered along with thick barium
showing normal distensibility of the esophagus normal caliber. No
visible mucosal abnormality. On the initial swallow there was a
single episode of esophageal peristalsis with slightly more focal
narrowing the distal esophagus best seen on series 5, images 1 and
2. Subsequent images show that this area distends normally.

Stomach was distended with gas with normal appearance.

Normal appearance of the duodenal bulb with normal duodenal jejunal
anatomy to the ligament of Treitz.

Single swallow assessment showed intact primary wave. During full
column assessment there was mild proximal escape of the bolus. No
sign of hiatal hernia. Barium tablet passed into the stomach without
difficulty.

Trace reflux exhibited on supine positioning.
IMPRESSION: 1. Intact primary wave with perhaps minimal dysmotility. There was 1
episode of distal esophageal peristaltic narrowing in upright
position but this normalized immediately following this peristaltic
wave and there was only mild proximal escape of the bolus with
otherwise intact primary wave on single swallow assessment.
2. No sign of hiatal hernia. Was felt to potentially represent
hiatal hernia at the time of the exam actually appears to be the
esophageal vestibule with peristaltic narrowing that is described
above.
3. Normal stomach and proximal portion of the small bowel to the
ligament of Treitz.

## 2021-07-02 ENCOUNTER — Other Ambulatory Visit: Payer: Self-pay | Admitting: Endocrinology

## 2021-07-02 DIAGNOSIS — E041 Nontoxic single thyroid nodule: Secondary | ICD-10-CM

## 2021-07-08 ENCOUNTER — Other Ambulatory Visit: Payer: Self-pay | Admitting: Obstetrics and Gynecology

## 2021-07-08 DIAGNOSIS — Z1231 Encounter for screening mammogram for malignant neoplasm of breast: Secondary | ICD-10-CM

## 2021-07-28 ENCOUNTER — Ambulatory Visit
Admission: RE | Admit: 2021-07-28 | Discharge: 2021-07-28 | Disposition: A | Discharge status: Home / Self Care | Payer: No Typology Code available for payment source | Source: Ambulatory Visit | Attending: Endocrinology | Admitting: Endocrinology

## 2021-07-28 DIAGNOSIS — E041 Nontoxic single thyroid nodule: Secondary | ICD-10-CM

## 2021-07-31 ENCOUNTER — Other Ambulatory Visit: Payer: Self-pay | Admitting: *Deleted

## 2021-07-31 DIAGNOSIS — D500 Iron deficiency anemia secondary to blood loss (chronic): Secondary | ICD-10-CM

## 2021-07-31 DIAGNOSIS — K909 Intestinal malabsorption, unspecified: Secondary | ICD-10-CM

## 2021-08-03 ENCOUNTER — Encounter: Payer: Self-pay | Admitting: *Deleted

## 2021-08-03 ENCOUNTER — Inpatient Hospital Stay: Payer: No Typology Code available for payment source | Attending: Hematology & Oncology

## 2021-08-03 ENCOUNTER — Other Ambulatory Visit: Payer: Self-pay

## 2021-08-03 ENCOUNTER — Inpatient Hospital Stay (HOSPITAL_BASED_OUTPATIENT_CLINIC_OR_DEPARTMENT_OTHER): Payer: No Typology Code available for payment source | Admitting: Hematology & Oncology

## 2021-08-03 ENCOUNTER — Encounter: Payer: Self-pay | Admitting: Hematology & Oncology

## 2021-08-03 VITALS — BP 109/45 | HR 71 | Temp 98.4°F | Resp 16 | Wt 125.0 lb

## 2021-08-03 DIAGNOSIS — K909 Intestinal malabsorption, unspecified: Secondary | ICD-10-CM

## 2021-08-03 DIAGNOSIS — D509 Iron deficiency anemia, unspecified: Secondary | ICD-10-CM | POA: Diagnosis present

## 2021-08-03 DIAGNOSIS — D508 Other iron deficiency anemias: Secondary | ICD-10-CM | POA: Diagnosis not present

## 2021-08-03 DIAGNOSIS — D500 Iron deficiency anemia secondary to blood loss (chronic): Secondary | ICD-10-CM

## 2021-08-03 LAB — CBC WITH DIFFERENTIAL (CANCER CENTER ONLY)
Abs Immature Granulocytes: 0.01 10*3/uL (ref 0.00–0.07)
Basophils Absolute: 0.1 10*3/uL (ref 0.0–0.1)
Basophils Relative: 2 %
Eosinophils Absolute: 0.2 10*3/uL (ref 0.0–0.5)
Eosinophils Relative: 5 %
HCT: 40.4 % (ref 36.0–46.0)
Hemoglobin: 13 g/dL (ref 12.0–15.0)
Immature Granulocytes: 0 %
Lymphocytes Relative: 44 %
Lymphs Abs: 2.1 10*3/uL (ref 0.7–4.0)
MCH: 28.3 pg (ref 26.0–34.0)
MCHC: 32.2 g/dL (ref 30.0–36.0)
MCV: 88 fL (ref 80.0–100.0)
Monocytes Absolute: 0.5 10*3/uL (ref 0.1–1.0)
Monocytes Relative: 11 %
Neutro Abs: 1.8 10*3/uL (ref 1.7–7.7)
Neutrophils Relative %: 38 %
Platelet Count: 238 10*3/uL (ref 150–400)
RBC: 4.59 MIL/uL (ref 3.87–5.11)
RDW: 13.8 % (ref 11.5–15.5)
WBC Count: 4.8 10*3/uL (ref 4.0–10.5)
nRBC: 0 % (ref 0.0–0.2)

## 2021-08-03 LAB — CMP (CANCER CENTER ONLY)
ALT: 47 U/L — ABNORMAL HIGH (ref 0–44)
AST: 37 U/L (ref 15–41)
Albumin: 4.2 g/dL (ref 3.5–5.0)
Alkaline Phosphatase: 92 U/L (ref 38–126)
Anion gap: 6 (ref 5–15)
BUN: 18 mg/dL (ref 6–20)
CO2: 31 mmol/L (ref 22–32)
Calcium: 9.7 mg/dL (ref 8.9–10.3)
Chloride: 103 mmol/L (ref 98–111)
Creatinine: 0.68 mg/dL (ref 0.44–1.00)
GFR, Estimated: >60 mL/min (ref >60)
Glucose, Bld: 99 mg/dL (ref 70–99)
Potassium: 4.5 mmol/L (ref 3.5–5.1)
Sodium: 140 mmol/L (ref 135–145)
Total Bilirubin: 0.4 mg/dL (ref 0.3–1.2)
Total Protein: 6.5 g/dL (ref 6.5–8.1)

## 2021-08-03 LAB — IRON AND TIBC
Iron: 91 ug/dL (ref 41–142)
Saturation Ratios: 31 % (ref 21–57)
TIBC: 288 ug/dL (ref 236–444)
UIBC: 197 ug/dL (ref 120–384)

## 2021-08-03 LAB — RETICULOCYTES
Immature Retic Fract: 4.9 % (ref 2.3–15.9)
RBC.: 4.53 MIL/uL (ref 3.87–5.11)
Retic Count, Absolute: 61.2 10*3/uL (ref 19.0–186.0)
Retic Ct Pct: 1.4 % (ref 0.4–3.1)

## 2021-08-03 LAB — FERRITIN: Ferritin: 470 ng/mL — ABNORMAL HIGH (ref 11–307)

## 2021-08-03 NOTE — Progress Notes (Signed)
Hematology and Oncology Follow Up Visit  Ann Calhoun [id number] [birth year] 55 y.o. 08/03/2021   Principle Diagnosis:  Iron deficiency anemia  Current Therapy:   IV iron-Monoferric given on 06/30/2021     Interim History:  Ann Calhoun is back for follow-up.  First saw her back in October.  At that time, she just was not feeling well.  She was fatigued.  We did find she had iron deficiency anemia.  Her iron studies showed a ferritin of 59 with an iron saturation of 13%.  I did going give her dose of Monoferric.  This is given on 06/30/2021.  Today, her iron studies are much better.  Her ferritin is 470 with iron saturation of 31%.  We will have to see that she tries to hold onto her iron.  I did do a ultrasound of her abdomen because I thought I felt her spleen tip.  This was done on 06/22/2021.  Thankfully, this did not show any enlarged liver or spleen.  I think she is feeling little bit better.  She has a bit more energy.  She has had no obvious bleeding.  She has had no obvious change in bowel bladder habits.  There are no rashes.  She did have a nice Thanksgiving with her family.  Her husband is a Dealer.  I know that he is very busy.  Currently, I would say performance status is probably ECOG 0.   Medications:  Current Outpatient Medications:    Ascorbic Acid (VITAMIN C WITH ROSE HIPS) 1000 MG tablet, Take 1,000 mg by mouth daily., Disp: , Rfl:    CYTOMEL 5 MCG tablet, Take 2 tablets by mouth daily., Disp: , Rfl:    MAGNESIUM PO, 340 mg daily. Take one in the AM and 2 in the PM, Disp: , Rfl:    melatonin 3 MG TABS tablet, Take 3 mg by mouth at bedtime., Disp: , Rfl:    NONFORMULARY OR COMPOUNDED ITEM, Place 60 mg onto the skin at bedtime. Progesterone cream, Disp: , Rfl:    TIROSINT 50 MCG CAPS, Take 50 mcg by mouth daily., Disp: , Rfl:    VITAMIN D, CHOLECALCIFEROL, PO, Take 1 tablet by mouth daily. Takes 2500 IU daily., Disp: , Rfl:    VIVELLE-DOT 0.0375  MG/24HR, 1 patch 2 (two) times a week., Disp: , Rfl:    Zinc Gluconate 30 MG TABS, daily., Disp: , Rfl:   Allergies:  Allergies  Allergen Reactions   Sulfa Antibiotics Hives and Itching   Nickel Rash    Past Medical History, Surgical history, Social history, and Family History were reviewed and updated.  Review of Systems: Review of Systems  Constitutional: Negative.   HENT:  Negative.    Eyes: Negative.   Respiratory: Negative.    Cardiovascular: Negative.   Gastrointestinal: Negative.   Endocrine: Negative.   Genitourinary: Negative.    Musculoskeletal: Negative.   Skin: Negative.   Neurological: Negative.   Hematological: Negative.   Psychiatric/Behavioral: Negative.     Physical Exam:  weight is 125 lb (56.7 kg). Her oral temperature is 98.4 F (36.9 C). Her blood pressure is 109/45 (abnormal) and her pulse is 71. Her respiration is 16 and oxygen saturation is 100%.   Wt Readings from Last 3 Encounters:  08/03/21 125 lb (56.7 kg)  06/15/21 124 lb 6.4 oz (56.4 kg)  05/12/21 125 lb (56.7 kg)    Physical Exam Vitals reviewed.  HENT:     Head: Normocephalic and atraumatic.  Eyes:     Pupils: Pupils are equal, round, and reactive to light.  Cardiovascular:     Rate and Rhythm: Normal rate and regular rhythm.     Heart sounds: Normal heart sounds.  Pulmonary:     Effort: Pulmonary effort is normal.     Breath sounds: Normal breath sounds.  Abdominal:     General: Bowel sounds are normal.     Palpations: Abdomen is soft.  Musculoskeletal:        General: No tenderness or deformity. Normal range of motion.     Cervical back: Normal range of motion.  Lymphadenopathy:     Cervical: No cervical adenopathy.  Skin:    General: Skin is warm and dry.     Findings: No erythema or rash.  Neurological:     Mental Status: She is alert and oriented to person, place, and time.  Psychiatric:        Behavior: Behavior normal.        Thought Content: Thought content  normal.        Judgment: Judgment normal.     Lab Results  Component Value Date   WBC 4.8 08/03/2021   HGB 13.0 08/03/2021   HCT 40.4 08/03/2021   MCV 88.0 08/03/2021   PLT 238 08/03/2021     Chemistry      Component Value Date/Time   NA 140 08/03/2021 0913   K 4.5 08/03/2021 0913   CL 103 08/03/2021 0913   CO2 31 08/03/2021 0913   BUN 18 08/03/2021 0913   CREATININE 0.68 08/03/2021 0913      Component Value Date/Time   CALCIUM 9.7 08/03/2021 0913   ALKPHOS 92 08/03/2021 0913   AST 37 08/03/2021 0913   ALT 47 (H) 08/03/2021 0913   BILITOT 0.4 08/03/2021 0913       Impression and Plan: Ann Calhoun is a very nice 55 year old white female.  It is a lot a fun talking to her.  Again her iron studies look good.  We will have to see how the iron levels trend.  I suppose she may have issues with absorption.  I think this would be hard to diagnose without biopsies.  I am just glad that she responded to the Monoferric.  Her iron levels do look nice.  I know she will have a nice Christmas with her family.  We will probably plan to get her back in January and see how everything looks.   Volanda Napoleon, MD 12/5/202210:10 AM

## 2021-08-06 ENCOUNTER — Telehealth: Payer: Self-pay | Admitting: *Deleted

## 2021-08-06 NOTE — Telephone Encounter (Signed)
No 08/03/21 los

## 2021-08-11 ENCOUNTER — Telehealth: Payer: Self-pay | Admitting: Internal Medicine

## 2021-08-11 NOTE — Telephone Encounter (Signed)
Inbound call from patient and states that she has been having a really weird feeling underneath her left rib, states that she had an ultra sound done and wanted to see if there was anything found in the results. States it feels like there is something stuck in there. The feeling comes and goes and she seems to feel it more in the afternoon. Please advise.

## 2021-08-12 NOTE — Telephone Encounter (Signed)
Pt states that she had an Upper Endoscopy / Bravo Stevens monitor on 05/12/2021. About three weeks after that she stated that she started having a "weird feeling" under her Left rib like a pulled muscle. On 06/15/2021 pt states that she was seeing her hematologist and that he observed that he felt like her Liver was enlarged. US abdomen Complete was ordered and done on 06/22/2021 in which was normal. Pt states that this feeling is not consistent but off and on, does not feel like cramps nor gas pain. Pt states that she does feel it  when she is bending and twisting. Pt states that this feeling in not that bad in the AM but  worsens as the day progresses. Please Advise.

## 2021-08-12 NOTE — Telephone Encounter (Signed)
Pt made aware of Dr. Gessner recommendations: Pt verbalized understanding with all questions answered.   

## 2021-08-12 NOTE — Telephone Encounter (Signed)
I do not think the procedure caused this. The history suggests it is musculoskeletal. She should seek f/u PCP about this musculoskeletal pain.

## 2021-08-14 ENCOUNTER — Ambulatory Visit: Payer: No Typology Code available for payment source

## 2021-08-14 ENCOUNTER — Other Ambulatory Visit: Payer: Self-pay

## 2021-08-14 ENCOUNTER — Ambulatory Visit
Admission: RE | Admit: 2021-08-14 | Discharge: 2021-08-14 | Disposition: A | Discharge status: Home / Self Care | Payer: No Typology Code available for payment source | Source: Ambulatory Visit | Attending: Obstetrics and Gynecology | Admitting: Obstetrics and Gynecology

## 2021-08-14 ENCOUNTER — Encounter: Payer: Self-pay | Admitting: Hematology & Oncology

## 2021-08-14 DIAGNOSIS — Z1231 Encounter for screening mammogram for malignant neoplasm of breast: Secondary | ICD-10-CM

## 2021-08-19 ENCOUNTER — Encounter: Payer: Self-pay | Admitting: Hematology & Oncology

## 2021-08-20 ENCOUNTER — Telehealth: Payer: Self-pay | Admitting: Hematology & Oncology

## 2021-08-20 NOTE — Telephone Encounter (Signed)
Scheduled appt per 12/5 LOS - called patient and left message with appt date and time on voicemail.

## 2021-09-18 ENCOUNTER — Encounter: Payer: Self-pay | Admitting: Hematology & Oncology

## 2021-09-24 ENCOUNTER — Encounter: Payer: Self-pay | Admitting: Hematology & Oncology

## 2021-09-25 ENCOUNTER — Inpatient Hospital Stay: Payer: PRIVATE HEALTH INSURANCE

## 2021-09-25 ENCOUNTER — Encounter: Payer: Self-pay | Admitting: Hematology & Oncology

## 2021-09-25 ENCOUNTER — Inpatient Hospital Stay: Payer: PRIVATE HEALTH INSURANCE | Attending: Hematology & Oncology | Admitting: Hematology & Oncology

## 2021-09-25 ENCOUNTER — Other Ambulatory Visit: Payer: Self-pay

## 2021-09-25 ENCOUNTER — Ambulatory Visit (HOSPITAL_BASED_OUTPATIENT_CLINIC_OR_DEPARTMENT_OTHER)
Admission: RE | Admit: 2021-09-25 | Discharge: 2021-09-25 | Disposition: A | Discharge status: Home / Self Care | Payer: No Typology Code available for payment source | Source: Ambulatory Visit | Attending: Hematology & Oncology | Admitting: Hematology & Oncology

## 2021-09-25 VITALS — BP 103/58 | HR 84 | Temp 98.2°F | Resp 18 | Ht 65.0 in | Wt 124.0 lb

## 2021-09-25 DIAGNOSIS — D509 Iron deficiency anemia, unspecified: Secondary | ICD-10-CM | POA: Insufficient documentation

## 2021-09-25 DIAGNOSIS — K909 Intestinal malabsorption, unspecified: Secondary | ICD-10-CM

## 2021-09-25 DIAGNOSIS — D500 Iron deficiency anemia secondary to blood loss (chronic): Secondary | ICD-10-CM | POA: Insufficient documentation

## 2021-09-25 DIAGNOSIS — E039 Hypothyroidism, unspecified: Secondary | ICD-10-CM | POA: Insufficient documentation

## 2021-09-25 DIAGNOSIS — D508 Other iron deficiency anemias: Secondary | ICD-10-CM

## 2021-09-25 LAB — CBC WITH DIFFERENTIAL (CANCER CENTER ONLY)
Abs Immature Granulocytes: 0.06 10*3/uL (ref 0.00–0.07)
Basophils Absolute: 0.1 10*3/uL (ref 0.0–0.1)
Basophils Relative: 1 %
Eosinophils Absolute: 0.2 10*3/uL (ref 0.0–0.5)
Eosinophils Relative: 3 %
HCT: 40.7 % (ref 36.0–46.0)
Hemoglobin: 13.1 g/dL (ref 12.0–15.0)
Immature Granulocytes: 1 %
Lymphocytes Relative: 29 %
Lymphs Abs: 1.9 10*3/uL (ref 0.7–4.0)
MCH: 28.6 pg (ref 26.0–34.0)
MCHC: 32.2 g/dL (ref 30.0–36.0)
MCV: 88.9 fL (ref 80.0–100.0)
Monocytes Absolute: 0.6 10*3/uL (ref 0.1–1.0)
Monocytes Relative: 10 %
Neutro Abs: 3.7 10*3/uL (ref 1.7–7.7)
Neutrophils Relative %: 56 %
Platelet Count: 253 10*3/uL (ref 150–400)
RBC: 4.58 MIL/uL (ref 3.87–5.11)
RDW: 13.1 % (ref 11.5–15.5)
WBC Count: 6.5 10*3/uL (ref 4.0–10.5)
nRBC: 0 % (ref 0.0–0.2)

## 2021-09-25 LAB — CMP (CANCER CENTER ONLY)
ALT: 16 U/L (ref 0–44)
AST: 16 U/L (ref 15–41)
Albumin: 4.4 g/dL (ref 3.5–5.0)
Alkaline Phosphatase: 81 U/L (ref 38–126)
Anion gap: 7 (ref 5–15)
BUN: 18 mg/dL (ref 6–20)
CO2: 30 mmol/L (ref 22–32)
Calcium: 9.6 mg/dL (ref 8.9–10.3)
Chloride: 103 mmol/L (ref 98–111)
Creatinine: 0.71 mg/dL (ref 0.44–1.00)
GFR, Estimated: >60 mL/min (ref >60)
Glucose, Bld: 81 mg/dL (ref 70–99)
Potassium: 3.9 mmol/L (ref 3.5–5.1)
Sodium: 140 mmol/L (ref 135–145)
Total Bilirubin: 0.5 mg/dL (ref 0.3–1.2)
Total Protein: 6.7 g/dL (ref 6.5–8.1)

## 2021-09-25 LAB — RETICULOCYTES
Immature Retic Fract: 4 % (ref 2.3–15.9)
RBC.: 4.54 MIL/uL (ref 3.87–5.11)
Retic Count, Absolute: 56.8 10*3/uL (ref 19.0–186.0)
Retic Ct Pct: 1.3 % (ref 0.4–3.1)

## 2021-09-25 LAB — SAVE SMEAR(SSMR), FOR PROVIDER SLIDE REVIEW

## 2021-09-25 NOTE — Progress Notes (Signed)
Hematology and Oncology Follow Up Visit  Ann Calhoun [id number] August 11, 1966 56 y.o. 09/25/2021   Principle Diagnosis:  Iron deficiency anemia  Current Therapy:   IV iron-Monoferric given on 06/30/2021     Interim History:  Ann Calhoun is back for follow-up.  She is doing quite well.  She feels better.  We last gave her iron back in November.  So far, she is holding onto it.  We last saw her in December, her iron saturation was 31%.  She is a little bit worried about her ongoing back down.  She has a big trip planned to [place] in May.  Her daughter and a friend of her daughters will be in [place], [place] for foreign study.  I told Ann Calhoun that we will see what her iron studies look like today.  This will gauge as when we need to recheck her.  I told her that we would definitely recheck her before she goes out of the country.  She is complaining of some pain in the upper abdomen.  This is close to the rib cage.  She is worried about a piece of metal that may have been stuck there when she had a procedure.  I told her we can get a chest x-ray and this could tell us if there is any metal.  She does have hypothyroidism.  She is followed by Dr. Chalmers Cater of Endocrinology.  Otherwise, she is doing pretty well.  She has had no issues with rashes.  There is been no leg swelling.  She has had no fever.  There is no cough.  She is really had no nausea or vomiting.  Currently, I would say performance status is probably ECOG 0.     Medications:  Current Outpatient Medications:    Ascorbic Acid (VITAMIN C WITH ROSE HIPS) 1000 MG tablet, Take 1,000 mg by mouth daily., Disp: , Rfl:    CYTOMEL 5 MCG tablet, Take 2 tablets by mouth daily., Disp: , Rfl:    MAGNESIUM PO, 340 mg daily. Take one in the AM and 2 in the PM, Disp: , Rfl:    melatonin 3 MG TABS tablet, Take 3 mg by mouth at bedtime., Disp: , Rfl:    NONFORMULARY OR COMPOUNDED ITEM, Place 60 mg onto the skin at bedtime. Progesterone cream,  Disp: , Rfl:    TIROSINT 50 MCG CAPS, Take 50 mcg by mouth daily., Disp: , Rfl:    VITAMIN D, CHOLECALCIFEROL, PO, Take 1 tablet by mouth daily. Takes 2500 IU daily., Disp: , Rfl:    VIVELLE-DOT 0.0375 MG/24HR, 1 patch 2 (two) times a week., Disp: , Rfl:    Zinc Gluconate 30 MG TABS, daily., Disp: , Rfl:   Allergies:  Allergies  Allergen Reactions   Sulfa Antibiotics Hives and Itching   Nickel Rash    Past Medical History, Surgical history, Social history, and Family History were reviewed and updated.  Review of Systems: Review of Systems  Constitutional: Negative.   HENT:  Negative.    Eyes: Negative.   Respiratory: Negative.    Cardiovascular: Negative.   Gastrointestinal: Negative.   Endocrine: Negative.   Genitourinary: Negative.    Musculoskeletal: Negative.   Skin: Negative.   Neurological: Negative.   Hematological: Negative.   Psychiatric/Behavioral: Negative.     Physical Exam:  height is 5\' 5"  (1.651 m) and weight is 124 lb (56.2 kg). Her oral temperature is 98.2 F (36.8 C). Her blood pressure is 103/58 (abnormal) and her pulse  is 84. Her respiration is 18 and oxygen saturation is 98%.   Wt Readings from Last 3 Encounters:  09/25/21 124 lb (56.2 kg)  08/03/21 125 lb (56.7 kg)  06/15/21 124 lb 6.4 oz (56.4 kg)    Physical Exam Vitals reviewed.  HENT:     Head: Normocephalic and atraumatic.  Eyes:     Pupils: Pupils are equal, round, and reactive to light.  Cardiovascular:     Rate and Rhythm: Normal rate and regular rhythm.     Heart sounds: Normal heart sounds.  Pulmonary:     Effort: Pulmonary effort is normal.     Breath sounds: Normal breath sounds.  Abdominal:     General: Bowel sounds are normal.     Palpations: Abdomen is soft.  Musculoskeletal:        General: No tenderness or deformity. Normal range of motion.     Cervical back: Normal range of motion.  Lymphadenopathy:     Cervical: No cervical adenopathy.  Skin:    General: Skin is  warm and dry.     Findings: No erythema or rash.  Neurological:     Mental Status: She is alert and oriented to person, place, and time.  Psychiatric:        Behavior: Behavior normal.        Thought Content: Thought content normal.        Judgment: Judgment normal.     Lab Results  Component Value Date   WBC 6.5 09/25/2021   HGB 13.1 09/25/2021   HCT 40.7 09/25/2021   MCV 88.9 09/25/2021   PLT 253 09/25/2021     Chemistry      Component Value Date/Time   NA 140 09/25/2021 1500   K 3.9 09/25/2021 1500   CL 103 09/25/2021 1500   CO2 30 09/25/2021 1500   BUN 18 09/25/2021 1500   CREATININE 0.71 09/25/2021 1500      Component Value Date/Time   CALCIUM 9.6 09/25/2021 1500   ALKPHOS 81 09/25/2021 1500   AST 16 09/25/2021 1500   ALT 16 09/25/2021 1500   BILITOT 0.5 09/25/2021 1500       Impression and Plan: Ann Calhoun is a very nice 56 year old white female.  It is a lot a fun talking to her.  I forgot to mention that she had a wonderful time out to [place] over Christmas.  0 to visit her family.  Thankfully, there were no issues with her flights.  She was not part of the [employer] cancellations.  Again we will have to see what her iron level shows.  This will gauge as to when we have to get her back.  I would have to believe that her iron should be okay as her MCV continues to rise.  Again we will plan to get her back depending on her iron studies.   Volanda Napoleon, MD 1/27/20233:43 PM

## 2021-09-28 ENCOUNTER — Telehealth: Payer: Self-pay | Admitting: *Deleted

## 2021-09-28 ENCOUNTER — Encounter: Payer: Self-pay | Admitting: *Deleted

## 2021-09-28 LAB — IRON AND IRON BINDING CAPACITY (CC-WL,HP ONLY)
Iron: 84 ug/dL (ref 28–170)
Saturation Ratios: 26 % (ref 10.4–31.8)
TIBC: 326 ug/dL (ref 250–450)
UIBC: 242 ug/dL (ref 148–442)

## 2021-09-28 LAB — FERRITIN: Ferritin: 372 ng/mL — ABNORMAL HIGH (ref 11–307)

## 2021-09-28 NOTE — Telephone Encounter (Signed)
-----   Message from Volanda Napoleon, MD sent at 09/28/2021  7:07 AM EST ----- Call - the CXR does NOT show any foreign body!!   Laurey Arrow

## 2021-09-28 NOTE — Telephone Encounter (Signed)
As noted below by Dr. Marin Olp, I left a message on the patient's cell phone informing her that the CXR does NOT show any foreign body! Instructed her to call the office if she had any questions or concerns.

## 2021-09-30 ENCOUNTER — Encounter: Payer: Self-pay | Admitting: Hematology & Oncology

## 2021-10-27 ENCOUNTER — Encounter: Payer: Self-pay | Admitting: Hematology & Oncology

## 2021-11-03 ENCOUNTER — Other Ambulatory Visit: Payer: Self-pay | Admitting: Internal Medicine

## 2021-11-03 DIAGNOSIS — R1013 Epigastric pain: Secondary | ICD-10-CM

## 2021-11-19 ENCOUNTER — Encounter: Payer: Self-pay | Admitting: Hematology & Oncology

## 2021-11-26 ENCOUNTER — Ambulatory Visit
Admission: RE | Admit: 2021-11-26 | Discharge: 2021-11-26 | Disposition: A | Discharge status: Home / Self Care | Payer: Self-pay | Source: Ambulatory Visit | Attending: Internal Medicine | Admitting: Internal Medicine

## 2021-11-26 DIAGNOSIS — R1013 Epigastric pain: Secondary | ICD-10-CM

## 2021-11-26 MED ORDER — IOPAMIDOL (ISOVUE-300) INJECTION 61%
100.0000 mL | Freq: Once | INTRAVENOUS | Status: AC | PRN
  Administered 2021-11-26: 100 mL via INTRAVENOUS

## 2021-11-27 ENCOUNTER — Other Ambulatory Visit: Payer: Self-pay | Admitting: *Deleted

## 2021-11-27 DIAGNOSIS — K909 Intestinal malabsorption, unspecified: Secondary | ICD-10-CM

## 2021-11-27 DIAGNOSIS — D508 Other iron deficiency anemias: Secondary | ICD-10-CM

## 2021-11-27 DIAGNOSIS — D500 Iron deficiency anemia secondary to blood loss (chronic): Secondary | ICD-10-CM

## 2021-11-30 ENCOUNTER — Inpatient Hospital Stay: Payer: No Typology Code available for payment source | Attending: Hematology & Oncology

## 2021-11-30 ENCOUNTER — Other Ambulatory Visit: Payer: Self-pay | Admitting: Internal Medicine

## 2021-11-30 ENCOUNTER — Inpatient Hospital Stay (HOSPITAL_BASED_OUTPATIENT_CLINIC_OR_DEPARTMENT_OTHER): Payer: No Typology Code available for payment source | Admitting: Hematology & Oncology

## 2021-11-30 ENCOUNTER — Encounter: Payer: Self-pay | Admitting: Hematology & Oncology

## 2021-11-30 ENCOUNTER — Other Ambulatory Visit: Payer: Self-pay

## 2021-11-30 VITALS — BP 92/74 | HR 86 | Temp 98.4°F | Resp 16 | Wt 121.0 lb

## 2021-11-30 DIAGNOSIS — D500 Iron deficiency anemia secondary to blood loss (chronic): Secondary | ICD-10-CM

## 2021-11-30 DIAGNOSIS — N1330 Unspecified hydronephrosis: Secondary | ICD-10-CM

## 2021-11-30 DIAGNOSIS — R202 Paresthesia of skin: Secondary | ICD-10-CM

## 2021-11-30 DIAGNOSIS — D509 Iron deficiency anemia, unspecified: Secondary | ICD-10-CM | POA: Diagnosis present

## 2021-11-30 DIAGNOSIS — D508 Other iron deficiency anemias: Secondary | ICD-10-CM

## 2021-11-30 DIAGNOSIS — I700 Atherosclerosis of aorta: Secondary | ICD-10-CM

## 2021-11-30 DIAGNOSIS — K909 Intestinal malabsorption, unspecified: Secondary | ICD-10-CM

## 2021-11-30 LAB — CMP (CANCER CENTER ONLY)
ALT: 23 U/L (ref 0–44)
AST: 19 U/L (ref 15–41)
Albumin: 4.6 g/dL (ref 3.5–5.0)
Alkaline Phosphatase: 68 U/L (ref 38–126)
Anion gap: 9 (ref 5–15)
BUN: 19 mg/dL (ref 6–20)
CO2: 27 mmol/L (ref 22–32)
Calcium: 9.7 mg/dL (ref 8.9–10.3)
Chloride: 100 mmol/L (ref 98–111)
Creatinine: 0.68 mg/dL (ref 0.44–1.00)
GFR, Estimated: >60 mL/min (ref >60)
Glucose, Bld: 88 mg/dL (ref 70–99)
Potassium: 4 mmol/L (ref 3.5–5.1)
Sodium: 136 mmol/L (ref 135–145)
Total Bilirubin: 0.4 mg/dL (ref 0.3–1.2)
Total Protein: 6.9 g/dL (ref 6.5–8.1)

## 2021-11-30 LAB — RETICULOCYTES
Immature Retic Fract: 5.1 % (ref 2.3–15.9)
RBC.: 4.55 MIL/uL (ref 3.87–5.11)
Retic Count, Absolute: 64.2 10*3/uL (ref 19.0–186.0)
Retic Ct Pct: 1.4 % (ref 0.4–3.1)

## 2021-11-30 LAB — URINALYSIS, COMPLETE (UACMP) WITH MICROSCOPIC
Bilirubin Urine: NEGATIVE
Glucose, UA: NEGATIVE mg/dL
Hgb urine dipstick: NEGATIVE
Ketones, ur: NEGATIVE mg/dL
Leukocytes,Ua: NEGATIVE
Nitrite: NEGATIVE
Protein, ur: NEGATIVE mg/dL
Specific Gravity, Urine: <1.005 — ABNORMAL LOW (ref 1.005–1.030)
pH: 5 (ref 5.0–8.0)

## 2021-11-30 LAB — CBC WITH DIFFERENTIAL (CANCER CENTER ONLY)
Abs Immature Granulocytes: 0.07 10*3/uL (ref 0.00–0.07)
Basophils Absolute: 0.1 10*3/uL (ref 0.0–0.1)
Basophils Relative: 1 %
Eosinophils Absolute: 0.2 10*3/uL (ref 0.0–0.5)
Eosinophils Relative: 3 %
HCT: 40.6 % (ref 36.0–46.0)
Hemoglobin: 13.3 g/dL (ref 12.0–15.0)
Immature Granulocytes: 1 %
Lymphocytes Relative: 31 %
Lymphs Abs: 2.4 10*3/uL (ref 0.7–4.0)
MCH: 29.4 pg (ref 26.0–34.0)
MCHC: 32.8 g/dL (ref 30.0–36.0)
MCV: 89.8 fL (ref 80.0–100.0)
Monocytes Absolute: 0.5 10*3/uL (ref 0.1–1.0)
Monocytes Relative: 7 %
Neutro Abs: 4.4 10*3/uL (ref 1.7–7.7)
Neutrophils Relative %: 57 %
Platelet Count: 250 10*3/uL (ref 150–400)
RBC: 4.52 MIL/uL (ref 3.87–5.11)
RDW: 11.8 % (ref 11.5–15.5)
WBC Count: 7.7 10*3/uL (ref 4.0–10.5)
nRBC: 0 % (ref 0.0–0.2)

## 2021-11-30 NOTE — Progress Notes (Signed)
?Hematology and Oncology Follow Up Visit ? ?[name] ?[id number] ?18-Jun-1966 56 y.o. ?11/30/2021 ? ? ?Principle Diagnosis:  ?Iron deficiency anemia ? ?Current Therapy:   ?IV iron-Monoferric given on 06/30/2021 ?    ?Interim History:  Ms. Ann Calhoun is back for follow-up.  She is doing pretty well.  She has done incredibly well with the iron that we gave her.  We last gave her iron back in November.  We saw her back in January, her ferritin was 372 with an iron saturation of 26%. ? ?She was seen by a complementary naturopathic doctor.  She had an extensive battery of test done.  She showed me a lot of these.  I must say that the tests were incredibly detailed.  She was told to start vitamin B12 injections.  She was given these at home.  I do not see a problem with her taking this. ? ?She did have a CT of the abdomen pelvis done.  This was done on March 30.  The CT showed mild right hydronephrosis.  She had a bunch of stool throughout the cecum extending through the transverse colon.  We are doing a urinalysis on her. ? ?She apparently is going to have a cardiac CT scan to look for any type of coronary artery calcifications. ? ?Everything is still set for her trip to [place] in late May.  I know that she will have a wonderful time and is looking forward to this. ? ?She had a birthday yesterday.  She had a wonderful birthday.  I am so happy that she could enjoy her birthday. ? ?Her husband, who is one of the Publishing copy in town, as he wrote a book.  She gave me a copy.  I am incredibly impressed to know that he wrote something so important and yet is so busy taking care of patients. ? ?She is still I think losing some hair. ? ?She has had no rash.  She has had no cough or shortness of breath. ? ?Overall, I would say performance status is ECOG 1.   ? ?Medications:  ?Current Outpatient Medications:  ?  Ascorbic Acid (VITAMIN C WITH ROSE HIPS) 1000 MG tablet, Take 1,000 mg by mouth daily., Disp: , Rfl:  ?   CYTOMEL 5 MCG tablet, Take 2 tablets by mouth daily., Disp: , Rfl:  ?  MAGNESIUM PO, 340 mg daily. Take one in the AM and 2 in the PM, Disp: , Rfl:  ?  melatonin 3 MG TABS tablet, Take 3 mg by mouth at bedtime., Disp: , Rfl:  ?  NONFORMULARY OR COMPOUNDED ITEM, Place 100 mg onto the skin at bedtime. Progesterone cream, Disp: , Rfl:  ?  TIROSINT 50 MCG CAPS, Take 50 mcg by mouth daily., Disp: , Rfl:  ?  VITAMIN D, CHOLECALCIFEROL, PO, Take 1 tablet by mouth daily. Takes 2500 IU daily., Disp: , Rfl:  ?  VIVELLE-DOT 0.05 MG/24HR patch, 1 patch 2 (two) times a week., Disp: , Rfl:  ?  ZINC GLUCONATE PO, Take 22 mg by mouth daily., Disp: , Rfl:  ? ?Allergies:  ?Allergies  ?Allergen Reactions  ? Nickel Rash and Other (See Comments)  ? Sulfa Antibiotics Hives, Itching and Other (See Comments)  ? ? ?Past Medical History, Surgical history, Social history, and Family History were reviewed and updated. ? ?Review of Systems: ?Review of Systems  ?Constitutional: Negative.   ?HENT:  Negative.    ?Eyes: Negative.   ?Respiratory: Negative.    ?Cardiovascular: Negative.   ?  Gastrointestinal: Negative.   ?Endocrine: Negative.   ?Genitourinary: Negative.    ?Musculoskeletal: Negative.   ?Skin: Negative.   ?Neurological: Negative.   ?Hematological: Negative.   ?Psychiatric/Behavioral: Negative.    ? ?Physical Exam: ? weight is 121 lb (54.9 kg). Her oral temperature is 98.4 ?F (36.9 ?C). Her blood pressure is 92/74 and her pulse is 86. Her respiration is 16 and oxygen saturation is 99%.  ? ?Wt Readings from Last 3 Encounters:  ?11/30/21 121 lb (54.9 kg)  ?09/25/21 124 lb (56.2 kg)  ?08/03/21 125 lb (56.7 kg)  ? ? ?Physical Exam ?Vitals reviewed.  ?HENT:  ?   Head: Normocephalic and atraumatic.  ?Eyes:  ?   Pupils: Pupils are equal, round, and reactive to light.  ?Cardiovascular:  ?   Rate and Rhythm: Normal rate and regular rhythm.  ?   Heart sounds: Normal heart sounds.  ?Pulmonary:  ?   Effort: Pulmonary effort is normal.  ?   Breath  sounds: Normal breath sounds.  ?Abdominal:  ?   General: Bowel sounds are normal.  ?   Palpations: Abdomen is soft.  ?Musculoskeletal:     ?   General: No tenderness or deformity. Normal range of motion.  ?   Cervical back: Normal range of motion.  ?Lymphadenopathy:  ?   Cervical: No cervical adenopathy.  ?Skin: ?   General: Skin is warm and dry.  ?   Findings: No erythema or rash.  ?Neurological:  ?   Mental Status: She is alert and oriented to person, place, and time.  ?Psychiatric:     ?   Behavior: Behavior normal.     ?   Thought Content: Thought content normal.     ?   Judgment: Judgment normal.  ? ? ? ?Lab Results  ?Component Value Date  ? WBC 7.7 11/30/2021  ? HGB 13.3 11/30/2021  ? HCT 40.6 11/30/2021  ? MCV 89.8 11/30/2021  ? PLT 250 11/30/2021  ? ?  Chemistry   ?   ?Component Value Date/Time  ? NA 136 11/30/2021 1504  ? K 4.0 11/30/2021 1504  ? CL 100 11/30/2021 1504  ? CO2 27 11/30/2021 1504  ? BUN 19 11/30/2021 1504  ? CREATININE 0.68 11/30/2021 1504  ?    ?Component Value Date/Time  ? CALCIUM 9.7 11/30/2021 1504  ? ALKPHOS 68 11/30/2021 1504  ? AST 19 11/30/2021 1504  ? ALT 23 11/30/2021 1504  ? BILITOT 0.4 11/30/2021 1504  ?  ? ? ? ?Impression and Plan: ?Ms. [name] is a very nice 56 year old white female.  It is always a lot a fun talking to her.   ? ?I would like to believe that her iron studies should be okay.  Her MCV is about 90.  She really has not dropped her hemoglobin at all. ? ?Again, I have to believe that she is going to do well when she goes over to [place].  I cannot imagine that her blood is going to be a problem for her.  I would think that her iron levels should be okay. ? ?I would like to see her back after she gets back from [place].  I will be interested to hear what kind of wonderful time she had over there.   ? ? ?Volanda Napoleon, MD ?4/3/20233:50 PM  ?

## 2021-12-01 ENCOUNTER — Encounter: Payer: Self-pay | Admitting: *Deleted

## 2021-12-01 LAB — IRON AND IRON BINDING CAPACITY (CC-WL,HP ONLY)
Iron: 60 ug/dL (ref 28–170)
Saturation Ratios: 17 % (ref 10.4–31.8)
TIBC: 349 ug/dL (ref 250–450)
UIBC: 289 ug/dL (ref 148–442)

## 2021-12-01 LAB — FERRITIN: Ferritin: 361 ng/mL — ABNORMAL HIGH (ref 11–307)

## 2021-12-18 DIAGNOSIS — M7989 Other specified soft tissue disorders: Secondary | ICD-10-CM

## 2021-12-22 ENCOUNTER — Other Ambulatory Visit: Payer: No Typology Code available for payment source

## 2022-01-04 ENCOUNTER — Encounter: Payer: Self-pay | Admitting: Hematology & Oncology

## 2022-01-04 ENCOUNTER — Ambulatory Visit
Admission: RE | Admit: 2022-01-04 | Discharge: 2022-01-04 | Disposition: A | Discharge status: Home / Self Care | Payer: No Typology Code available for payment source | Source: Ambulatory Visit | Attending: Internal Medicine | Admitting: Internal Medicine

## 2022-01-04 DIAGNOSIS — I700 Atherosclerosis of aorta: Secondary | ICD-10-CM

## 2022-01-05 ENCOUNTER — Other Ambulatory Visit: Payer: Self-pay | Admitting: *Deleted

## 2022-01-05 DIAGNOSIS — D500 Iron deficiency anemia secondary to blood loss (chronic): Secondary | ICD-10-CM

## 2022-01-06 ENCOUNTER — Inpatient Hospital Stay: Payer: No Typology Code available for payment source | Attending: Hematology & Oncology

## 2022-01-06 DIAGNOSIS — D500 Iron deficiency anemia secondary to blood loss (chronic): Secondary | ICD-10-CM

## 2022-01-06 DIAGNOSIS — D509 Iron deficiency anemia, unspecified: Secondary | ICD-10-CM | POA: Insufficient documentation

## 2022-01-06 LAB — CBC WITH DIFFERENTIAL (CANCER CENTER ONLY)
Abs Immature Granulocytes: 0.03 10*3/uL (ref 0.00–0.07)
Basophils Absolute: 0.1 10*3/uL (ref 0.0–0.1)
Basophils Relative: 1 %
Eosinophils Absolute: 0.2 10*3/uL (ref 0.0–0.5)
Eosinophils Relative: 4 %
HCT: 40.3 % (ref 36.0–46.0)
Hemoglobin: 13.1 g/dL (ref 12.0–15.0)
Immature Granulocytes: 1 %
Lymphocytes Relative: 45 %
Lymphs Abs: 2.2 10*3/uL (ref 0.7–4.0)
MCH: 29.2 pg (ref 26.0–34.0)
MCHC: 32.5 g/dL (ref 30.0–36.0)
MCV: 90 fL (ref 80.0–100.0)
Monocytes Absolute: 0.5 10*3/uL (ref 0.1–1.0)
Monocytes Relative: 9 %
Neutro Abs: 2 10*3/uL (ref 1.7–7.7)
Neutrophils Relative %: 40 %
Platelet Count: 220 10*3/uL (ref 150–400)
RBC: 4.48 MIL/uL (ref 3.87–5.11)
RDW: 11.7 % (ref 11.5–15.5)
WBC Count: 5 10*3/uL (ref 4.0–10.5)
nRBC: 0 % (ref 0.0–0.2)

## 2022-01-06 LAB — COMPREHENSIVE METABOLIC PANEL
ALT: 18 U/L (ref 0–44)
AST: 19 U/L (ref 15–41)
Albumin: 4.3 g/dL (ref 3.5–5.0)
Alkaline Phosphatase: 62 U/L (ref 38–126)
Anion gap: 5 (ref 5–15)
BUN: 24 mg/dL — ABNORMAL HIGH (ref 6–20)
CO2: 29 mmol/L (ref 22–32)
Calcium: 9.3 mg/dL (ref 8.9–10.3)
Chloride: 104 mmol/L (ref 98–111)
Creatinine, Ser: 0.7 mg/dL (ref 0.44–1.00)
GFR, Estimated: >60 mL/min (ref >60)
Glucose, Bld: 91 mg/dL (ref 70–99)
Potassium: 4.3 mmol/L (ref 3.5–5.1)
Sodium: 138 mmol/L (ref 135–145)
Total Bilirubin: 0.4 mg/dL (ref 0.3–1.2)
Total Protein: 6.4 g/dL — ABNORMAL LOW (ref 6.5–8.1)

## 2022-01-06 LAB — IRON AND IRON BINDING CAPACITY (CC-WL,HP ONLY)
Iron: 82 ug/dL (ref 28–170)
Saturation Ratios: 24 % (ref 10.4–31.8)
TIBC: 336 ug/dL (ref 250–450)
UIBC: 254 ug/dL (ref 148–442)

## 2022-01-06 LAB — FERRITIN: Ferritin: 228 ng/mL (ref 11–307)

## 2022-01-06 LAB — RETICULOCYTES
Immature Retic Fract: 6.7 % (ref 2.3–15.9)
RBC.: 4.54 MIL/uL (ref 3.87–5.11)
Retic Count, Absolute: 49 10*3/uL (ref 19.0–186.0)
Retic Ct Pct: 1.1 % (ref 0.4–3.1)

## 2022-01-12 ENCOUNTER — Telehealth: Payer: Self-pay | Admitting: Internal Medicine

## 2022-01-12 NOTE — Telephone Encounter (Signed)
Patient added on for tomorrow. ?

## 2022-01-12 NOTE — Telephone Encounter (Signed)
Patient had abdominal pain and felt unwell on Mother's Day.  Had some intense pain.  Has been constipated a bit.  Because of problems had a CT scan which showed some focal left-sided colitis changes.  Has been started on Augmentin since last night.  Had a temperature elevation with fever 102+ on Mother's Day night.  No persistent fever no bleeding. ? ?I have an opening at 850 tomorrow and she will be seen.  We will add her to the schedule. ?

## 2022-01-13 ENCOUNTER — Other Ambulatory Visit (INDEPENDENT_AMBULATORY_CARE_PROVIDER_SITE_OTHER): Payer: No Typology Code available for payment source

## 2022-01-13 ENCOUNTER — Encounter: Payer: Self-pay | Admitting: Internal Medicine

## 2022-01-13 ENCOUNTER — Ambulatory Visit (INDEPENDENT_AMBULATORY_CARE_PROVIDER_SITE_OTHER): Payer: No Typology Code available for payment source | Admitting: Internal Medicine

## 2022-01-13 ENCOUNTER — Ambulatory Visit (INDEPENDENT_AMBULATORY_CARE_PROVIDER_SITE_OTHER)
Admission: RE | Admit: 2022-01-13 | Discharge: 2022-01-13 | Disposition: A | Discharge status: Home / Self Care | Payer: No Typology Code available for payment source | Source: Ambulatory Visit | Attending: Internal Medicine | Admitting: Internal Medicine

## 2022-01-13 VITALS — BP 98/60 | HR 76 | Ht 65.0 in | Wt 118.8 lb

## 2022-01-13 DIAGNOSIS — R933 Abnormal findings on diagnostic imaging of other parts of digestive tract: Secondary | ICD-10-CM | POA: Diagnosis not present

## 2022-01-13 DIAGNOSIS — R1032 Left lower quadrant pain: Secondary | ICD-10-CM | POA: Diagnosis not present

## 2022-01-13 DIAGNOSIS — K5900 Constipation, unspecified: Secondary | ICD-10-CM

## 2022-01-13 LAB — COMPREHENSIVE METABOLIC PANEL
ALT: 23 U/L (ref 0–35)
AST: 16 U/L (ref 0–37)
Albumin: 4.1 g/dL (ref 3.5–5.2)
Alkaline Phosphatase: 59 U/L (ref 39–117)
BUN: 10 mg/dL (ref 6–23)
CO2: 30 mEq/L (ref 19–32)
Calcium: 9.2 mg/dL (ref 8.4–10.5)
Chloride: 100 mEq/L (ref 96–112)
Creatinine, Ser: 0.64 mg/dL (ref 0.40–1.20)
GFR: 98.31 mL/min (ref >60.00)
Glucose, Bld: 95 mg/dL (ref 70–99)
Potassium: 4.1 mEq/L (ref 3.5–5.1)
Sodium: 137 mEq/L (ref 135–145)
Total Bilirubin: 0.5 mg/dL (ref 0.2–1.2)
Total Protein: 6.8 g/dL (ref 6.0–8.3)

## 2022-01-13 LAB — CBC WITH DIFFERENTIAL/PLATELET
Basophils Absolute: 0 10*3/uL (ref 0.0–0.1)
Basophils Relative: 1.4 % (ref 0.0–3.0)
Eosinophils Absolute: 0.2 10*3/uL (ref 0.0–0.7)
Eosinophils Relative: 5 % (ref 0.0–5.0)
HCT: 37.4 % (ref 36.0–46.0)
Hemoglobin: 12.4 g/dL (ref 12.0–15.0)
Lymphocytes Relative: 32.1 % (ref 12.0–46.0)
Lymphs Abs: 1.1 10*3/uL (ref 0.7–4.0)
MCHC: 33.2 g/dL (ref 30.0–36.0)
MCV: 87.8 fl (ref 78.0–100.0)
Monocytes Absolute: 0.4 10*3/uL (ref 0.1–1.0)
Monocytes Relative: 10.9 % (ref 3.0–12.0)
Neutro Abs: 1.8 10*3/uL (ref 1.4–7.7)
Neutrophils Relative %: 50.6 % (ref 43.0–77.0)
Platelets: 263 10*3/uL (ref 150.0–400.0)
RBC: 4.26 Mil/uL (ref 3.87–5.11)
RDW: 11.9 % (ref 11.5–15.5)
WBC: 3.5 10*3/uL — ABNORMAL LOW (ref 4.0–10.5)

## 2022-01-13 LAB — LIPASE: Lipase: 30 U/L (ref 11.0–59.0)

## 2022-01-13 NOTE — Progress Notes (Signed)
Ann [place] 56 y.o. 1965-11-07 [id number]  Assessment & Plan:   Encounter Diagnoses  Name Primary?   Abnormal CT scan, colon Yes   Constipation, unspecified constipation type    LLQ pain     This seems inflammatory as opposed to neoplastic given the overall available evidence.  She still feels constipated so I had her do a 2 view x-ray of the abdomen and it showed some retained contrast.  There is some stool there but she is not distended or impacted or anything like that.  It did also show some diverticulosis.  We had not known that before.  That raises question of these CT findings being related to diverticulitis.   Labs were drawn:   Lab Results  Component Value Date   WBC 3.5 (L) 01/13/2022   HGB 12.4 01/13/2022   HCT 37.4 01/13/2022   MCV 87.8 01/13/2022   PLT 263.0 01/13/2022   Lab Results  Component Value Date   CREATININE 0.64 01/13/2022   BUN 10 01/13/2022   NA 137 01/13/2022   K 4.1 01/13/2022   CL 100 01/13/2022   CO2 30 01/13/2022   Lab Results  Component Value Date   ALT 23 01/13/2022   AST 16 01/13/2022   ALKPHOS 59 01/13/2022   BILITOT 0.5 01/13/2022   She does not exhibit signs of pancreatic insufficiency.  I explained this to her.  She remains concerned about her unintentional weight loss.  Doubt her recent course of nystatin would have triggered any of this.  Candida in the stool is known to be present in healthy people I do not believe that was likely to be a pathogen.  We reviewed that as well.  I contacted her and we reviewed things and she is feeling better.  She is about to depart to [place] for a trip with her daughter and another mother and daughter.  I have recommended she take the Augmentin for 7 to 10 days if she feels completely well at 7 days she can stop if not go for 10 days.  Limit raw vegetables for the time being.  She did reports she is feeling better and with less bloating.  She is taking some magnesium pills to promote  defecation. Anticipate arranging for a colonoscopy when she returns from her trip.  Depending upon clinical status might need to repeat CT scan.  CC: Ginger Organ., MD    Subjective:   Chief C I can omplaint: Abdominal pain abnormal colon on CT question colitis versus other  HPI This is a 56 year old white woman with a long history of GI complaints without clear etiology who developed fairly intense left lower quadrant abdominal pain on Mother's Day several days ago, it worsened she had a Tmax of 1018.  She had been experiencing increasing constipation of late.  She had been tested by her naturopath and was found to have Candida in the stool and took a multiweek course of nystatin.  Because of the pain and tenderness she had she was seen at [facility] where a CT scan demonstrated a vague abnormality in the region of the sigmoid colon with wall thickening and pericolonic inflammatory change in the mesocolon.  She had some diarrhea after taking the CT contrast.  She has been started on Augmentin 875 mg twice daily.  She is not having fever she is improved.  She still feels like she is not moving her bowels well despite trying some laxatives.  She has used Dulcolax  with a small bowel movement produced.  She is concerned because her fecal elastase has declined though is still normal at 221, and other studies showed increased IgA in the feces (secretory IgA), though calprotectin was 8 she was heme-negative.  She has wondered if she has pancreatic insufficiency.  She reports unintentional weight loss as well.  Note she has not had any rectal bleeding.  Colonoscopy recently as below.  CT abdomen and Pelvis w/ contrast IMPRESSION: 1. Interval development of a vague region of sigmoid colon wall thickening and associated hazy inflammatory changes in the adjacent mesocolon in the left lower quadrant. Findings appear to be new compared to 11/26/2021. Given the relatively abrupt appearance of the  abnormality, a localized infectious or inflammatory process (focal colitis versus torsed epiploic appendagitis) is favored and colonic neoplasm is considered less likely. Consider follow-up colonoscopy following resolution of the acute symptoms if colonoscopy has not been performed in the last several years. 2. Otherwise, no acute abnormality within the abdomen or pelvis. 3. Stable hepatic and renal cysts. 4. Mild grade 1 anterolisthesis of L5 on S1. 5. Trace atherosclerotic calcifications along the abdominal aorta. Aortic Atherosclerosis (ICD10-I70.0).   Electronically Signed By: Jacqulynn Cadet M.D. On: 01/11/2022 17:32    Colonoscopy March 2020 - One 8 mm polyp in the cecum, removed piecemeal using a cold stiff snare. Resected and retrieved.-Sessile serrated polyp plan to repeat a colonoscopy 2025 on a routine basis - The examination was otherwise normal on direct and retroflexion views.   Additional GI work-up includes a negative EGD with normal CLO test in September 2022 with a Bravo pH probe that was negative for significant reflux.  In October 2021 she had an esophageal manometry that was normal and a negative pH impedance.  She has seen Dr. Matilde Bash and abdominal breathing therapy led to relief of supra gastric belching.   Wt Readings from Last 3 Encounters:  01/13/22 118 lb 12.8 oz (53.9 kg)  11/30/21 121 lb (54.9 kg)  09/25/21 124 lb (56.2 kg)    Allergies  Allergen Reactions   Nickel Rash and Other (See Comments)   Sulfa Antibiotics Hives, Itching and Other (See Comments)   Current Meds  Medication Sig   amoxicillin (AMOXIL) 875 MG tablet Take 875 mg by mouth 2 (two) times daily.   Ascorbic Acid (VITAMIN C WITH ROSE HIPS) 1000 MG tablet Take 1,000 mg by mouth daily.   CYTOMEL 5 MCG tablet Take 2 tablets by mouth daily.   MAGNESIUM PO 340 mg daily. Take one in the AM and 2 in the PM   melatonin 3 MG TABS tablet Take 3 mg by mouth at bedtime.   PROGESTERONE  MICRONIZED PO Take by mouth. Compounded pill   TIROSINT 50 MCG CAPS Take 50 mcg by mouth daily.   VITAMIN D, CHOLECALCIFEROL, PO Take 1 tablet by mouth daily. Takes 2500 IU daily.   VIVELLE-DOT 0.05 MG/24HR patch 1 patch 2 (two) times a week.   ZINC GLUCONATE PO Take 22 mg by mouth daily.   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   Kidney stones    Nephrolithiasis    in her 20's   Osteopenia  Postpartum depression    Vitamin D deficiency    Past Surgical History:  Procedure Laterality Date   26 HOUR [place] STUDY N/A 06/25/2020   Procedure: 24 HOUR PH STUDY;  Surgeon: Gatha Mayer, MD;  Location: [facility];  Service: Endoscopy;  Laterality: N/A;   BREAST BIOPSY Left 2015   benign   BREAST BIOPSY Left 02/01/2019   CESAREAN SECTION     uterus repair for perforation   COLONOSCOPY  multiple   ESOPHAGEAL MANOMETRY N/A 06/25/2020   Procedure: ESOPHAGEAL MANOMETRY (EM);  Surgeon: Gatha Mayer, MD;  Location: [facility];  Service: Endoscopy;  Laterality: N/A;   [place]   got septic afterwards.   UPPER GASTROINTESTINAL ENDOSCOPY  05/12/2021   Bravo capsule placement   Social History   Social History Narrative   Married to Dr. Nicki Reaper Menard   One daughter   family history includes Asthma in her brother; Atrial fibrillation in her mother; Breast cancer in her maternal grandmother; CVA in her paternal grandmother; Colon cancer (age of onset: 41) in her maternal grandfather; Colon polyps in her mother; Diabetes in her father; Esophageal cancer in her maternal grandmother; Glaucoma in her maternal grandmother; Hypothyroidism in her mother.   Review of Systems As per  HPI  Objective:   Physical Exam BP 98/60   Pulse 76   Ht '5\' 5"'$  (1.651 m)   Wt 118 lb 12.8 oz (53.9 kg)   LMP 05/01/2014 (Within Months) Comment: Pt states that she has not had a period in 2 years, then had one ~35-40 days ago.  BMI 19.77 kg/m  Thin well-developed well-nourished white woman in no acute distress Abdomen is thin soft there is mild tenderness in the left lower quadrant without rebound or mass effect

## 2022-01-13 NOTE — Patient Instructions (Signed)
Your provider has requested that you go to the basement level for lab work before leaving today. Press "B" on the elevator. The lab is located at the first door on the left as you exit the elevator. ? ?Due to recent changes in healthcare laws, you may see the results of your imaging and laboratory studies on MyChart before your provider has had a chance to review them.  We understand that in some cases there may be results that are confusing or concerning to you. Not all laboratory results come back in the same time frame and the provider may be waiting for multiple results in order to interpret others.  Please give [place] 48 hours in order for your provider to thoroughly review all the results before contacting the office for clarification of your results.  ? ?Please go to the x-ray department in the basement before leaving. ? ? ?I appreciate the opportunity to care for you. ?Silvano Rusk, MD, [facility] ?

## 2022-01-19 ENCOUNTER — Telehealth: Payer: Self-pay

## 2022-01-19 NOTE — Telephone Encounter (Signed)
Spoke with pt regarding appointment time to see Dr. Gwenlyn Found. Pt scheduled to see Dr. Gwenlyn Found 6/22.

## 2022-02-03 ENCOUNTER — Inpatient Hospital Stay: Payer: No Typology Code available for payment source

## 2022-02-03 ENCOUNTER — Ambulatory Visit (INDEPENDENT_AMBULATORY_CARE_PROVIDER_SITE_OTHER): Payer: No Typology Code available for payment source | Admitting: Internal Medicine

## 2022-02-03 ENCOUNTER — Ambulatory Visit: Payer: No Typology Code available for payment source | Admitting: Hematology & Oncology

## 2022-02-03 ENCOUNTER — Encounter: Payer: Self-pay | Admitting: Internal Medicine

## 2022-02-03 VITALS — BP 90/60 | HR 80 | Ht 65.0 in | Wt 118.0 lb

## 2022-02-03 DIAGNOSIS — K5900 Constipation, unspecified: Secondary | ICD-10-CM | POA: Diagnosis not present

## 2022-02-03 DIAGNOSIS — R634 Abnormal weight loss: Secondary | ICD-10-CM | POA: Diagnosis not present

## 2022-02-03 DIAGNOSIS — R1012 Left upper quadrant pain: Secondary | ICD-10-CM | POA: Diagnosis not present

## 2022-02-03 DIAGNOSIS — R933 Abnormal findings on diagnostic imaging of other parts of digestive tract: Secondary | ICD-10-CM | POA: Diagnosis not present

## 2022-02-03 MED ORDER — PEG-KCL-NACL-NASULF-NA ASC-C 100 G PO SOLR
1.0000 | Freq: Once | ORAL | 0 refills | Status: AC
Start: 2022-02-03 — End: 2022-02-03

## 2022-02-03 NOTE — Progress Notes (Signed)
Ann Calhoun 56 y.o. 11-Jul-1966 [id number]  Assessment & Plan:   Encounter Diagnoses  Name Primary?   Abnormal CT scan, colon Yes   Constipation, unspecified constipation type    LUQ pain    Weight loss      We will evaluate the abnormal CT scan of the colon with colonoscopy.  She probably had diverticulitis.  Multiple concerns about health addressed.  I tried to reassure her that having some wine is not going to cause diabetes and that for my review she has not had significant hyperglycemia except for 1 time a few years ago I saw level of 113.  2 CT scans within 6 weeks of this year with normal pancreas really goes against any pancreatic abnormality.  We can consider an MRI pending the results of the colonoscopy but its not clear to me at all that there is any indication for this.  She has lost weight but she has changed eating. Also has had similar weight as now in 2021. She has been gluten-free for a long time because that apparently interferes with Hashimoto's per her other practitioners, but she has also been reducing sugar out of fear of getting diabetes and changing eating.  Her weight is level recently.  Explained that stress can have an effect as well and she is clearly stressed and worried about her symptoms and possible diagnoses.  She has been focused on the story of a missed pancreatic cancer that a primary care physician told her about regarding one of their patients i.e. a CT did not demonstrate pancreatic cancer but an MRI then did.  I explained that she did not have all of the details of that and all that is a possible scenario in her case I think it is incredibly unlikely given that the pancreas has looked normal on 2 contrasted exams in March and May of this year.  Regarding her floating stools and questions about pancreatic insufficiency and malabsorption, I told her I did not think she had any significant signs of malabsorption.  Diagnosis based upon a ferritin of  59 and iron saturation of 13%.  Note that celiac testing with tissue transglutaminase antibody and IgA levels were normal in 2021. Ann Cal[place] has postulated she was malabsorbing iron. With numerous recent restrictive diets I believe it could be nutritional problem.  She may liberalize her diet.  I reviewed that there are no specific dietary restrictions with diverticulosis that we know work.  So the old recommendations of restricting seeds nuts and popcorn etc. are not followed by me and most others.  Ann [place]., [place]   Subjective:   Chief Complaint: bowel habit change  HPI  Ann Calhoun is a 56 yo ww here for f/u of possible diverticulitis - in May had LLQ pain + fever and CT showed sigmoid colon wall abnormality and inflammatory changes in mesocolon. Prior to that and on that CT no hx diverticulosis but a subsequent KUB showed diverticula with residual CT contrast in diverticula. She has had struggles with constipation that are ongoing. Taking Mg Citrate pills + Mg glycinate She did do ok on trip to [place] but stayed on a bland diet. Remains concerned about  weight loss and possibility of malabsorption.  Has had normal but decreasing levels of fecal elastase and floating stools at times. Eating more but not gaining weight. Stays gluten free due to Hashimoto dz and reported benefit.  Also still has a LUQ pain since EGD and  Bravo placement last Fall.Someone who was a PCP [place] told her a story about a patuient with abd pain, had neg CT but then had MRI that found pancreatic cancer and she remains concerned she could have pancreatic cancer and is asking about having an MRI.  She has also been treated for iron deficiency and believes she was malabsorbing iron. Ferritin was 59 so it was technically not low though she was given iron def anemia dx. She did have a saturation of 13 % then  Wt Readings from Last 3 Encounters:  02/03/22 118 lb (53.5 kg)  01/13/22 118 lb 12.8 oz  (53.9 kg)  11/30/21 121 lb (54.9 kg)    Allergies  Allergen Reactions   Nickel Rash and Other (See Comments)   Sulfa Antibiotics Hives, Itching and Other (See Comments)   Current Meds  Medication Sig   Ascorbic Acid (VITAMIN C WITH ROSE HIPS) 1000 MG tablet Take 500 mg by mouth daily.   CYTOMEL 5 MCG tablet Take 2 tablets by mouth daily.   MAGNESIUM PO 340 mg daily. Take one in the AM and 2 in the PM   melatonin 3 MG TABS tablet Take 3 mg by mouth at bedtime.   PROGESTERONE MICRONIZED PO Take by mouth. Compounded pill   TIROSINT 50 MCG CAPS Take 50 mcg by mouth daily.   VITAMIN D, CHOLECALCIFEROL, PO Take 1 tablet by mouth daily. Takes 2500 IU daily.   VIVELLE-DOT 0.05 MG/24HR patch 1 patch 2 (two) times a week.   ZINC GLUCONATE PO Take 22 mg by mouth daily.   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   Kidney stones    Nephrolithiasis    in her 20's   Osteopenia    Postpartum depression    Vitamin D deficiency    Past Surgical History:  Procedure Laterality Date   83 HOUR [place] STUDY N/A 06/25/2020   Procedure: 24 HOUR [place] STUDY;  Surgeon: Gatha Mayer, [place];  Location: [facility];  Service: Endoscopy;  Laterality: N/A;   BREAST BIOPSY Left 2015   benign   BREAST BIOPSY Left 02/01/2019   CESAREAN SECTION     uterus repair for perforation   COLONOSCOPY  multiple   ESOPHAGEAL MANOMETRY N/A 06/25/2020   Procedure: ESOPHAGEAL MANOMETRY (EM);  Surgeon: Gatha Mayer, [place];  Location: [facility];  Service: Endoscopy;  Laterality: N/A;   [place]   got septic afterwards.   UPPER GASTROINTESTINAL ENDOSCOPY  05/12/2021   Bravo capsule placement   Social History   Social History  Narrative   Married to [place]   One daughter   family history includes Asthma in her brother; Atrial fibrillation in her mother; Breast cancer in her maternal grandmother; CVA in her paternal grandmother; Colon cancer (age of onset: 48) in her maternal grandfather; Colon polyps in her mother; Diabetes in her father; Esophageal cancer in her maternal grandmother; Glaucoma in her maternal grandmother; Hypothyroidism in her mother.   Review of Systems As per HPI  Objective:   Physical Exam BP 90/60   Pulse 80   Ht '5\' 5"'$  (1.651 m)   Wt 118 lb (53.5  kg)   LMP 05/01/2014 (Within Months) Comment: Pt states that she has not had a period in 2 years, then had one ~35-40 days ago.  BMI 19.64 kg/m  Well-developed well-nourished white woman in no acute distress The abdomen is soft perhaps slightly tender to deep palpation in the left lower quadrant without organomegaly or mass.

## 2022-02-03 NOTE — Patient Instructions (Signed)
You have been scheduled for a colonoscopy. Please follow written instructions given to you at your visit today.  Please pick up your prep supplies at the pharmacy within the next 1-3 days. If you use inhalers (even only as needed), please bring them with you on the day of your procedure.   Due to recent changes in healthcare laws, you may see the results of your imaging and laboratory studies on MyChart before your provider has had a chance to review them.  We understand that in some cases there may be results that are confusing or concerning to you. Not all laboratory results come back in the same time frame and the provider may be waiting for multiple results in order to interpret others.  Please give us 48 hours in order for your provider to thoroughly review all the results before contacting the office for clarification of your results.    I appreciate the opportunity to care for you. Carl Gessner, MD, FACG 

## 2022-02-04 ENCOUNTER — Other Ambulatory Visit: Payer: Self-pay | Admitting: Internal Medicine

## 2022-02-04 MED ORDER — PEG-KCL-NACL-NASULF-NA ASC-C 100 G PO SOLR: 1.0000 | Freq: Once | ORAL | 0 refills | Status: AC

## 2022-02-09 ENCOUNTER — Other Ambulatory Visit: Payer: No Typology Code available for payment source

## 2022-02-09 ENCOUNTER — Ambulatory Visit: Payer: No Typology Code available for payment source | Admitting: Hematology & Oncology

## 2022-02-12 ENCOUNTER — Other Ambulatory Visit: Payer: Self-pay | Admitting: Lab

## 2022-02-12 ENCOUNTER — Inpatient Hospital Stay: Payer: No Typology Code available for payment source | Attending: Hematology & Oncology

## 2022-02-12 DIAGNOSIS — N1330 Unspecified hydronephrosis: Secondary | ICD-10-CM

## 2022-02-12 DIAGNOSIS — R202 Paresthesia of skin: Secondary | ICD-10-CM

## 2022-02-12 DIAGNOSIS — D500 Iron deficiency anemia secondary to blood loss (chronic): Secondary | ICD-10-CM

## 2022-02-12 DIAGNOSIS — D509 Iron deficiency anemia, unspecified: Secondary | ICD-10-CM | POA: Insufficient documentation

## 2022-02-12 LAB — CMP (CANCER CENTER ONLY)
ALT: 28 U/L (ref 0–44)
AST: 21 U/L (ref 15–41)
Albumin: 4.4 g/dL (ref 3.5–5.0)
Alkaline Phosphatase: 62 U/L (ref 38–126)
Anion gap: 6 (ref 5–15)
BUN: 23 mg/dL — ABNORMAL HIGH (ref 6–20)
CO2: 30 mmol/L (ref 22–32)
Calcium: 9.4 mg/dL (ref 8.9–10.3)
Chloride: 103 mmol/L (ref 98–111)
Creatinine: 0.35 mg/dL — ABNORMAL LOW (ref 0.44–1.00)
GFR, Estimated: >60 mL/min (ref >60)
Glucose, Bld: 95 mg/dL (ref 70–99)
Potassium: 4.3 mmol/L (ref 3.5–5.1)
Sodium: 139 mmol/L (ref 135–145)
Total Bilirubin: 0.5 mg/dL (ref 0.3–1.2)
Total Protein: 7 g/dL (ref 6.5–8.1)

## 2022-02-12 LAB — CBC WITH DIFFERENTIAL (CANCER CENTER ONLY)
Abs Immature Granulocytes: 0.01 10*3/uL (ref 0.00–0.07)
Basophils Absolute: 0.1 10*3/uL (ref 0.0–0.1)
Basophils Relative: 1 %
Eosinophils Absolute: 0.2 10*3/uL (ref 0.0–0.5)
Eosinophils Relative: 4 %
HCT: 43 % (ref 36.0–46.0)
Hemoglobin: 13.6 g/dL (ref 12.0–15.0)
Immature Granulocytes: 0 %
Lymphocytes Relative: 36 %
Lymphs Abs: 2 10*3/uL (ref 0.7–4.0)
MCH: 28.9 pg (ref 26.0–34.0)
MCHC: 31.6 g/dL (ref 30.0–36.0)
MCV: 91.3 fL (ref 80.0–100.0)
Monocytes Absolute: 0.4 10*3/uL (ref 0.1–1.0)
Monocytes Relative: 7 %
Neutro Abs: 2.9 10*3/uL (ref 1.7–7.7)
Neutrophils Relative %: 52 %
Platelet Count: 243 10*3/uL (ref 150–400)
RBC: 4.71 MIL/uL (ref 3.87–5.11)
RDW: 12 % (ref 11.5–15.5)
WBC Count: 5.5 10*3/uL (ref 4.0–10.5)
nRBC: 0 % (ref 0.0–0.2)

## 2022-02-12 LAB — IRON AND IRON BINDING CAPACITY (CC-WL,HP ONLY)
Iron: 73 ug/dL (ref 28–170)
Saturation Ratios: 20 % (ref 10.4–31.8)
TIBC: 358 ug/dL (ref 250–450)
UIBC: 285 ug/dL (ref 148–442)

## 2022-02-12 LAB — VITAMIN B12: Vitamin B-12: >7500 pg/mL — ABNORMAL HIGH (ref 180–914)

## 2022-02-12 LAB — RETICULOCYTES
Immature Retic Fract: 7 % (ref 2.3–15.9)
RBC.: 4.63 MIL/uL (ref 3.87–5.11)
Retic Count, Absolute: 62.5 10*3/uL (ref 19.0–186.0)
Retic Ct Pct: 1.4 % (ref 0.4–3.1)

## 2022-02-12 LAB — FERRITIN: Ferritin: 220 ng/mL (ref 11–307)

## 2022-02-12 LAB — SAVE SMEAR(SSMR), FOR PROVIDER SLIDE REVIEW

## 2022-02-13 LAB — IGG, IGA, IGM
IgA: 253 mg/dL (ref 87–352)
IgG (Immunoglobin G), Serum: 803 mg/dL (ref 586–1602)
IgM (Immunoglobulin M), Srm: 129 mg/dL (ref 26–217)

## 2022-02-14 ENCOUNTER — Encounter: Payer: Self-pay | Admitting: Certified Registered Nurse Anesthetist

## 2022-02-16 ENCOUNTER — Encounter: Payer: Self-pay | Admitting: Internal Medicine

## 2022-02-17 ENCOUNTER — Inpatient Hospital Stay (HOSPITAL_BASED_OUTPATIENT_CLINIC_OR_DEPARTMENT_OTHER): Payer: No Typology Code available for payment source | Admitting: Hematology & Oncology

## 2022-02-17 ENCOUNTER — Other Ambulatory Visit: Payer: Self-pay

## 2022-02-17 ENCOUNTER — Encounter: Payer: Self-pay | Admitting: Hematology & Oncology

## 2022-02-17 ENCOUNTER — Other Ambulatory Visit: Payer: No Typology Code available for payment source

## 2022-02-17 VITALS — BP 93/40 | HR 72 | Temp 98.2°F | Resp 18 | Ht 65.0 in | Wt 118.0 lb

## 2022-02-17 DIAGNOSIS — R202 Paresthesia of skin: Secondary | ICD-10-CM

## 2022-02-17 DIAGNOSIS — D501 Sideropenic dysphagia: Secondary | ICD-10-CM | POA: Diagnosis not present

## 2022-02-17 DIAGNOSIS — D509 Iron deficiency anemia, unspecified: Secondary | ICD-10-CM | POA: Diagnosis not present

## 2022-02-17 NOTE — Progress Notes (Signed)
Hematology and Oncology Follow Up Visit  Ann Calhoun [id number] 1965-09-23 56 y.o. 02/17/2022   Principle Diagnosis:  Iron deficiency anemia  Current Therapy:   IV iron-Monoferric given on 06/30/2021     Interim History:  Ann Cal[place] is back for follow-up.  Unfortunately, right before she went over to [place], she developed what sounds like diverticulitis.  She was not hospitalized.  She had to watch her diet while she was over in [place] visiting her daughter.  She has a colonoscopy that is good to be done this Friday.  She had a cardiac calcium score.  Apparently, this was on the higher side.  Her primary care doctor wants her to be on a statin drug.  She does not want to be on a statin drug.  I told her that she needs to talk to her family doctor about this.  She is going to see a cardiologist tomorrow I think.  Her iron studies have all look quite good.  She has responded very well to IV iron.  We have not had to give her iron now for probably 7 months.  When we had labs work done on her last week, her ferritin was 220 with an iron saturation of 20%.  For some reason, her vitamin B12 level is incredibly high.  I am unsure as if this was a truly accurate indicator.  She is worried a little bit about weight loss.  She is lost 8 pounds.  She does see a naturopathic doctor who does help her out.  Her immunoglobulin status all looked pretty good.  Her IgG level was 800 mg/dL.  Her IgA level was 250 mg/dL.  She has not had any fever.  She has had no cough or shortness of breath.  She has had no rashes.  Overall, I would say her performance status is probably ECOG 1.     Medications:  Current Outpatient Medications:    Ascorbic Acid (VITAMIN C WITH ROSE HIPS) 1000 MG tablet, Take 500 mg by mouth daily., Disp: , Rfl:    CYTOMEL 5 MCG tablet, Take 2 tablets by mouth daily., Disp: , Rfl:    melatonin 3 MG TABS tablet, Take 3 mg by mouth at bedtime., Disp: , Rfl:    OVER THE  COUNTER MEDICATION, Magnesium Glycinate '150mg'$   2 pills daily at bedtime, Disp: , Rfl:    OVER THE COUNTER MEDICATION, Magnesium citrate '270mg'$  Sig: 1-2 pills daily at dinner, Disp: , Rfl:    PROGESTERONE MICRONIZED PO, Take 100 mg by mouth daily. Compounded pill, Disp: , Rfl:    TIROSINT 50 MCG CAPS, Take 50 mcg by mouth daily., Disp: , Rfl:    VITAMIN D, CHOLECALCIFEROL, PO, Take 1 tablet by mouth daily. Takes 2500 IU daily., Disp: , Rfl:    VIVELLE-DOT 0.05 MG/24HR patch, 1 patch 2 (two) times a week., Disp: , Rfl:    ZINC GLUCONATE PO, Take 22 mg by mouth daily., Disp: , Rfl:   Allergies:  Allergies  Allergen Reactions   Nickel Rash   Sulfa Antibiotics Hives, Itching and Other (See Comments)    Past Medical History, Surgical history, Social history, and Family History were reviewed and updated.  Review of Systems: Review of Systems  Constitutional: Negative.   HENT:  Negative.    Eyes: Negative.   Respiratory: Negative.    Cardiovascular: Negative.   Gastrointestinal: Negative.   Endocrine: Negative.   Genitourinary: Negative.    Musculoskeletal: Negative.   Skin: Negative.  Neurological: Negative.   Hematological: Negative.   Psychiatric/Behavioral: Negative.      Physical Exam:  height is '5\' 5"'$  (1.651 m) and weight is 118 lb (53.5 kg). Her oral temperature is 98.2 F (36.8 C). Her blood pressure is 93/40 (abnormal) and her pulse is 72. Her respiration is 18 and oxygen saturation is 100%.   Wt Readings from Last 3 Encounters:  02/17/22 118 lb (53.5 kg)  02/03/22 118 lb (53.5 kg)  01/13/22 118 lb 12.8 oz (53.9 kg)    Physical Exam Vitals reviewed.  HENT:     Head: Normocephalic and atraumatic.  Eyes:     Pupils: Pupils are equal, round, and reactive to light.  Cardiovascular:     Rate and Rhythm: Normal rate and regular rhythm.     Heart sounds: Normal heart sounds.  Pulmonary:     Effort: Pulmonary effort is normal.     Breath sounds: Normal breath sounds.   Abdominal:     General: Bowel sounds are normal.     Palpations: Abdomen is soft.  Musculoskeletal:        General: No tenderness or deformity. Normal range of motion.     Cervical back: Normal range of motion.  Lymphadenopathy:     Cervical: No cervical adenopathy.  Skin:    General: Skin is warm and dry.     Findings: No erythema or rash.  Neurological:     Mental Status: She is alert and oriented to person, place, and time.  Psychiatric:        Behavior: Behavior normal.        Thought Content: Thought content normal.        Judgment: Judgment normal.     Lab Results  Component Value Date   WBC 5.5 02/12/2022   HGB 13.6 02/12/2022   HCT 43.0 02/12/2022   MCV 91.3 02/12/2022   PLT 243 02/12/2022     Chemistry      Component Value Date/Time   NA 139 02/12/2022 0846   K 4.3 02/12/2022 0846   CL 103 02/12/2022 0846   CO2 30 02/12/2022 0846   BUN 23 (H) 02/12/2022 0846   CREATININE 0.35 (L) 02/12/2022 0846      Component Value Date/Time   CALCIUM 9.4 02/12/2022 0846   ALKPHOS 62 02/12/2022 0846   AST 21 02/12/2022 0846   ALT 28 02/12/2022 0846   BILITOT 0.5 02/12/2022 0846       Impression and Plan: Ann Calhoun is a very nice 57 year old white female.  It is always a lot a fun talking to her.    I am happy that her iron studies look good.  Again, I am not sure as to why her vitamin B12 was so high.  She says that she has had problems of metabolizing B vitamins.  At this point, I think we can probably have her labs checked in about a month.  She likes to come in to have labs done before we see her back.  At this would work for me.  Hopefully, the colonoscopy will go well for her.  It sounds like she has diverticulosis which, I am sure, the colonoscopy will reveal.   Volanda Napoleon, MD 6/21/20234:04 PM

## 2022-02-18 ENCOUNTER — Encounter: Payer: Self-pay | Admitting: Cardiovascular Disease

## 2022-02-18 ENCOUNTER — Ambulatory Visit (INDEPENDENT_AMBULATORY_CARE_PROVIDER_SITE_OTHER): Payer: No Typology Code available for payment source | Admitting: Cardiovascular Disease

## 2022-02-18 VITALS — BP 98/56 | HR 65 | Ht 65.0 in | Wt 117.0 lb

## 2022-02-18 DIAGNOSIS — Z7689 Persons encountering health services in other specified circumstances: Secondary | ICD-10-CM

## 2022-02-18 DIAGNOSIS — R931 Abnormal findings on diagnostic imaging of heart and coronary circulation: Secondary | ICD-10-CM

## 2022-02-18 DIAGNOSIS — E782 Mixed hyperlipidemia: Secondary | ICD-10-CM

## 2022-02-18 DIAGNOSIS — E785 Hyperlipidemia, unspecified: Secondary | ICD-10-CM | POA: Insufficient documentation

## 2022-02-18 NOTE — Assessment & Plan Note (Signed)
Coronary calcium score of 127 principally in the RCA.  This was performed 01/05/2022.  She is completely asymptomatic.  She has declined statin therapy.  I will see her back as needed.

## 2022-02-18 NOTE — Progress Notes (Signed)
02/18/2022 Ann Calhoun   01-28-1966  [id number]  Primary Physician Ginger Organ., MD Primary Cardiologist: Lorretta Harp MD Ann Calhoun, [place]  HPI:  Ann Calhoun is a 57 y.o. thin-appearing married Caucasian female mother of 1 daughter who is a Paramedic in Secretary/administ[employer].  Her husband Ann Calhoun  is a urologist here in town.  She is self-referred because of recently performed coronary calcium score which was 127.  She works Emergency planning/management officer to neurologist.  Her only cardiac risk factor is mild hyperlipidemia.  She also has #Hashimoto's thyroiditis.  There is no family history of heart disease.  She is never had a heart attack or stroke.  She is pretty active.  She denies chest pain or shortness of breath.  She had a coronary calcium score performed 01/04/2022 which was 127 in the RCA territory.  She is self-referred to discuss this.   Current Meds  Medication Sig   Ascorbic Acid (VITAMIN C WITH ROSE HIPS) 1000 MG tablet Take 500 mg by mouth daily.   CYTOMEL 5 MCG tablet Take 2 tablets by mouth daily.   melatonin 3 MG TABS tablet Take 3 mg by mouth at bedtime.   OVER THE COUNTER MEDICATION Magnesium Glycinate '150mg'$   2 pills daily at bedtime   OVER THE COUNTER MEDICATION Magnesium citrate '270mg'$  Sig: 1-2 pills daily at dinner   PROGESTERONE MICRONIZED PO Take 100 mg by mouth daily. Compounded pill   TIROSINT 50 MCG CAPS Take 50 mcg by mouth daily.   VITAMIN D, CHOLECALCIFEROL, PO Take 1 tablet by mouth daily. Takes 2500 IU daily.   VIVELLE-DOT 0.05 MG/24HR patch 1 patch 2 (two) times a week.   ZINC GLUCONATE PO Take 22 mg by mouth daily.     Allergies  Allergen Reactions   Nickel Rash   Sulfa Antibiotics Hives, Itching and Other (See Comments)    Social History   Socioeconomic History   Marital status: Married    Spouse name: Not on file   Number of children: Not on file   Years of education: Not on file   Highest education level: Not on file   Occupational History   Not on file  Tobacco Use   Smoking status: Former    Types: Cigarettes    Quit date: 12/30/1995    Years since quitting: 26.1   Smokeless tobacco: Never   Tobacco comments:    occasional at that time  Vaping Use   Vaping Use: Never used  Substance and Sexual Activity   Alcohol use: Yes    Alcohol/week: 4.0 standard drinks of alcohol    Types: 4 Glasses of wine per week    Comment: not  currently   Drug use: No   Sexual activity: Not on file  Other Topics Concern   Not on file  Social History Narrative   Married to Dr. Bjorn Loser   One daughter   Social Determinants of Health   Financial Resource Strain: Not on file  Food Insecurity: Not on file  Transportation Needs: Not on file  Physical Activity: Not on file  Stress: Not on file  Social Connections: Not on file  Intimate Partner Violence: Not on file     Review of Systems: General: negative for chills, fever, night sweats or weight changes.  Cardiovascular: negative for chest pain, dyspnea on exertion, edema, orthopnea, palpitations, paroxysmal nocturnal dyspnea or shortness of breath Dermatological: negative for rash Respiratory: negative for cough or  wheezing Urologic: negative for hematuria Abdominal: negative for nausea, vomiting, diarrhea, bright red blood per rectum, melena, or hematemesis Neurologic: negative for visual changes, syncope, or dizziness All other systems reviewed and are otherwise negative except as noted above.    Blood pressure (!) 98/56, pulse 65, height '5\' 5"'$  (1.651 m), weight 117 lb (53.1 kg), last menstrual period 05/01/2014.  General appearance: alert and no distress Neck: no adenopathy, no carotid bruit, no JVD, supple, symmetrical, trachea midline, and thyroid not enlarged, symmetric, no tenderness/mass/nodules Lungs: clear to auscultation bilaterally Heart: regular rate and rhythm, S1, S2 normal, no murmur, click, rub or gallop Extremities: extremities  normal, atraumatic, no cyanosis or edema Pulses: 2+ and symmetric Skin: Skin color, texture, turgor normal. No rashes or lesions Neurologic: Grossly normal  EKG sinus rhythm at 65 with nonspecific ST and T wave changes.  Personally reviewed this EKG.  ASSESSMENT AND PLAN:   Hyperlipidemia Mild hyperlipidemia with recent lipid profile performed 12/17/2021 revealing total cholesterol 94, LDL 110 HDL 74.  Patient does have a mildly elevated coronary calcium score of 127 principally in the RCA but she is totally asymptomatic.  For various reasons, she is hesitant to start a statin drug.  Elevated coronary artery calcium score Coronary calcium score of 127 principally in the RCA.  This was performed 01/05/2022.  She is completely asymptomatic.  She has declined statin therapy.  I will see her back as needed.     Lorretta Harp MD FACP,FACC,FAHA, [facility] 02/18/2022 3:37 PM

## 2022-02-18 NOTE — Assessment & Plan Note (Signed)
Mild hyperlipidemia with recent lipid profile performed 12/17/2021 revealing total cholesterol 94, LDL 110 HDL 74.  Patient does have a mildly elevated coronary calcium score of 127 principally in the RCA but she is totally asymptomatic.  For various reasons, she is hesitant to start a statin drug.

## 2022-02-18 NOTE — Patient Instructions (Signed)
Medication Instructions:  Your physician recommends that you continue on your current medications as directed. Please refer to the Current Medication list given to you today.  *If you need a refill on your cardiac medications before your next appointment, please call your pharmacy*   Lab Work: None If you have labs (blood work) drawn today and your tests are completely normal, you will receive your results only by: [place] (if you have MyChart) OR A paper copy in the mail If you have any lab test that is abnormal or we need to change your treatment, we will call you to review the results.   Testing/Procedures: None   Follow-Up: At [facility], you and your health needs are our priority.  As part of our continuing mission to provide you with exceptional heart care, we have created designated Provider Care Teams.  These Care Teams include your primary Cardiologist (physician) and Advanced Practice Providers (APPs -  Physician Assistants and Nurse Practitioners) who all work together to provide you with the care you need, when you need it.  We recommend signing up for the patient portal called "MyChart".  Sign up information is provided on this After Visit Summary.  MyChart is used to connect with patients for Virtual Visits (Telemedicine).  Patients are able to view lab/test results, encounter notes, upcoming appointments, etc.  Non-urgent messages can be sent to your provider as well.   To learn more about what you can do with MyChart, go to [email or web address].    Your next appointment:    As needed  The format for your next appointment:   In Person  Provider:   Quay Burow, MD   Other Instructions   Important Information About Sugar

## 2022-02-19 ENCOUNTER — Encounter: Payer: Self-pay | Admitting: Internal Medicine

## 2022-02-19 ENCOUNTER — Ambulatory Visit (AMBULATORY_SURGERY_CENTER): Payer: No Typology Code available for payment source | Admitting: Internal Medicine

## 2022-02-19 VITALS — BP 110/52 | HR 78 | Temp 97.7°F | Resp 16 | Ht 65.0 in | Wt 118.0 lb

## 2022-02-19 DIAGNOSIS — D125 Benign neoplasm of sigmoid colon: Secondary | ICD-10-CM

## 2022-02-19 DIAGNOSIS — R933 Abnormal findings on diagnostic imaging of other parts of digestive tract: Secondary | ICD-10-CM

## 2022-02-19 MED ORDER — SODIUM CHLORIDE 0.9 % IV SOLN: 500.0000 mL | Freq: Once | INTRAVENOUS | Status: DC

## 2022-02-19 NOTE — Progress Notes (Signed)
[facility] History and Physical   Primary Care Physician:  Cleatis Polka., MD   Reason for Procedure:   Abnormal colon on CT - suspect diverticulitius  Plan:    colonoscopy     HPI: Ann Calhoun is a 56 y.o. female here for f/u of possible diverticulitis - in May had LLQ pain + fever and CT showed sigmoid colon wall abnormality and inflammatory changes in mesocolon. Prior to that and on that CT no hx diverticulosis but a subsequent KUB showed diverticula with residual CT contrast in diverticula. She has had struggles with constipation that are ongoing. Taking Mg Citrate pills + Mg glycinate She did do ok on trip to [place] but stayed on a bland diet. Remains concerned about  weight loss and possibility of malabsorption.  Has had normal but decreasing levels of fecal elastase and floating stools at times. Eating more but not gaining weight. Stays gluten free due to Hashimoto dz and reported benefit.   Also still has a LUQ pain since EGD and Bravo placement last Fall.Someone who was a PCP MD told her a story about a patuient with abd pain, had neg CT but then had MRI that found pancreatic cancer and she remains concerned she could have pancreatic cancer and is asking about having an MRI.   She has also been treated for iron deficiency and believes she was malabsorbing iron. Ferritin was 59 so it was technically not low though she was given iron def anemia dx. 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   Kidney stones    Nephrolithiasis    in her 20's   Osteopenia    Postpartum depression    Vitamin D deficiency     Past Surgical History:  Procedure Laterality Date   6 HOUR PH STUDY N/A 06/25/2020   Procedure: 24 HOUR PH STUDY;  Surgeon: Iva Boop, MD;  Location: [facility];  Service: Endoscopy;  Laterality: N/A;   BREAST BIOPSY Left 2015   benign   BREAST BIOPSY Left 02/01/2019   CESAREAN SECTION     uterus repair for perforation   COLONOSCOPY  multiple   ESOPHAGEAL MANOMETRY N/A 06/25/2020   Procedure: ESOPHAGEAL MANOMETRY (EM);  Surgeon: Iva Boop, MD;  Location: [facility];  Service: Endoscopy;  Laterality: N/A;   KIDNEY STONE SURGERY  1991   got septic afterwards.   UPPER GASTROINTESTINAL ENDOSCOPY  05/12/2021   Bravo capsule placement    Prior to Admission medications   Medication Sig Start Date End Date Taking? Authorizing Provider  Ascorbic Acid (VITAMIN C WITH ROSE HIPS) 1000 MG tablet Take 500 mg by mouth daily.   Yes [provider]  CYTOMEL 5 MCG tablet Take 2 tablets by mouth daily. 10/27/18  Yes [provider]  melatonin 3 MG TABS tablet  Take 3 mg by mouth at bedtime.   Yes [provider]  OVER THE COUNTER MEDICATION Magnesium Glycinate 150mg   2 pills daily at bedtime   Yes [provider]  PROGESTERONE MICRONIZED PO Take 100 mg by mouth daily. Compounded pill   Yes [provider]  TIROSINT 50 MCG CAPS Take 50 mcg by mouth daily. 07/31/21  Yes [provider]  VITAMIN D, CHOLECALCIFEROL, PO Take 1 tablet by mouth daily. Takes 2500 IU daily.   Yes [provider]  VIVELLE-DOT 0.05 MG/24HR patch 1 patch 2 (two) times a week. 11/14/21  Yes [provider]  ZINC GLUCONATE PO Take 22 mg by mouth daily.   Yes [provider]  OVER THE COUNTER MEDICATION Magnesium citrate 270mg  Sig: 1-2 pills daily at  dinner    [provider]    Current Outpatient Medications  Medication Sig Dispense Refill   Ascorbic Acid (VITAMIN C WITH ROSE HIPS) 1000 MG tablet Take 500 mg by mouth daily.     CYTOMEL 5 MCG tablet Take 2 tablets by mouth daily.     melatonin 3 MG TABS tablet Take 3 mg by mouth at bedtime.     OVER THE COUNTER MEDICATION Magnesium Glycinate 150mg   2 pills daily at bedtime     PROGESTERONE MICRONIZED PO Take 100 mg by mouth daily. Compounded pill     TIROSINT 50 MCG CAPS Take 50 mcg by mouth daily.     VITAMIN D, CHOLECALCIFEROL, PO Take 1 tablet by mouth daily. Takes 2500 IU daily.     VIVELLE-DOT 0.05 MG/24HR patch 1 patch 2 (two) times a week.     ZINC GLUCONATE PO Take 22 mg by mouth daily.     OVER THE COUNTER MEDICATION Magnesium citrate 270mg  Sig: 1-2 pills daily at dinner     Current Facility-Administered Medications  Medication Dose Route Frequency Provider Last Rate Last Admin   0.9 %  sodium chloride infusion  500 mL Intravenous Once Iva Boop, MD        Allergies as of 02/19/2022 - Review Complete 02/19/2022  Allergen Reaction Noted   Nickel Rash 05/12/2021   Sulfa antibiotics Hives, Itching, and Other (See Comments) 10/09/2012    Family History  Problem Relation Age of Onset   Colon polyps Mother    Atrial fibrillation Mother    Hypothyroidism Mother    Colon cancer Maternal Grandfather 35   Breast cancer Maternal Grandmother    Esophageal cancer Maternal Grandmother    Glaucoma Maternal Grandmother    Diabetes Father    Asthma Brother    CVA Paternal Grandmother    Rectal cancer Neg Hx    Stomach cancer Neg Hx     Social History   Socioeconomic History   Marital status: Married    Spouse name: Not on file   Number of children: Not on file   Years of education: Not on file   Highest education level: Not on file  Occupational History   Not on file  Tobacco Use   Smoking status: Former    Types: Cigarettes    Quit date: 12/30/1995     Years since quitting: 26.1   Smokeless tobacco: Never   Tobacco comments:    occasional at that time  Vaping Use   Vaping Use: Never used  Substance and Sexual Activity   Alcohol use: Yes    Alcohol/week: 4.0 standard drinks of alcohol    Types: 4 Glasses of wine per  week    Comment: not  currently   Drug use: No   Sexual activity: Not on file  Other Topics Concern   Not on file  Social History Narrative   Married to Dr. Alfredo Martinez   One daughter   Social Determinants of Health   Financial Resource Strain: Not on file  Food Insecurity: Not on file  Transportation Needs: Not on file  Physical Activity: Not on file  Stress: Not on file  Social Connections: Not on file  Intimate Partner Violence: Not on file    Review of Systems:  All other review of systems negative except as mentioned in the HPI.  Physical Exam: Vital signs BP 103/68 (BP Location: Right Arm, Patient Position: Sitting, Cuff Size: Normal)   Pulse 87   Temp 97.7 F (36.5 C) (Temporal)   Ht 5\' 5"  (1.651 m)   Wt 118 lb (53.5 kg)   LMP 05/01/2014 (Within Months)   SpO2 100%   BMI 19.64 kg/m   General:   Alert,  Well-developed, well-nourished, pleasant and cooperative in NAD Lungs:  Clear throughout to auscultation.   Heart:  Regular rate and rhythm; no murmurs, clicks, rubs,  or gallops. Abdomen:  Soft, nontender and nondistended. Normal bowel sounds.   Neuro/Psych:  Alert and cooperative. Normal mood and affect. A and O x 3   @Aleksey Newbern  Sena Slate, MD, [facility] [phone] (pager) 02/19/2022 3:08 PM@

## 2022-02-22 ENCOUNTER — Telehealth: Payer: Self-pay | Admitting: *Deleted

## 2022-02-25 ENCOUNTER — Encounter: Payer: Self-pay | Admitting: Internal Medicine

## 2022-03-18 ENCOUNTER — Inpatient Hospital Stay: Payer: No Typology Code available for payment source | Attending: Hematology & Oncology

## 2022-03-18 DIAGNOSIS — R109 Unspecified abdominal pain: Secondary | ICD-10-CM | POA: Insufficient documentation

## 2022-03-18 DIAGNOSIS — D501 Sideropenic dysphagia: Secondary | ICD-10-CM

## 2022-03-18 DIAGNOSIS — D509 Iron deficiency anemia, unspecified: Secondary | ICD-10-CM | POA: Diagnosis not present

## 2022-03-18 DIAGNOSIS — R202 Paresthesia of skin: Secondary | ICD-10-CM

## 2022-03-18 DIAGNOSIS — R634 Abnormal weight loss: Secondary | ICD-10-CM | POA: Diagnosis not present

## 2022-03-18 LAB — CMP (CANCER CENTER ONLY)
ALT: 22 U/L (ref 0–44)
AST: 17 U/L (ref 15–41)
Albumin: 4.6 g/dL (ref 3.5–5.0)
Alkaline Phosphatase: 67 U/L (ref 38–126)
Anion gap: 5 (ref 5–15)
BUN: 22 mg/dL — ABNORMAL HIGH (ref 6–20)
CO2: 30 mmol/L (ref 22–32)
Calcium: 9.6 mg/dL (ref 8.9–10.3)
Chloride: 103 mmol/L (ref 98–111)
Creatinine: 0.73 mg/dL (ref 0.44–1.00)
GFR, Estimated: >60 mL/min (ref >60)
Glucose, Bld: 97 mg/dL (ref 70–99)
Potassium: 4.3 mmol/L (ref 3.5–5.1)
Sodium: 138 mmol/L (ref 135–145)
Total Bilirubin: 0.5 mg/dL (ref 0.3–1.2)
Total Protein: 6.9 g/dL (ref 6.5–8.1)

## 2022-03-18 LAB — CBC WITH DIFFERENTIAL (CANCER CENTER ONLY)
Abs Immature Granulocytes: 0.01 10*3/uL (ref 0.00–0.07)
Basophils Absolute: 0.1 10*3/uL (ref 0.0–0.1)
Basophils Relative: 1 %
Eosinophils Absolute: 0.2 10*3/uL (ref 0.0–0.5)
Eosinophils Relative: 4 %
HCT: 42.3 % (ref 36.0–46.0)
Hemoglobin: 13.6 g/dL (ref 12.0–15.0)
Immature Granulocytes: 0 %
Lymphocytes Relative: 36 %
Lymphs Abs: 1.7 10*3/uL (ref 0.7–4.0)
MCH: 28.9 pg (ref 26.0–34.0)
MCHC: 32.2 g/dL (ref 30.0–36.0)
MCV: 89.8 fL (ref 80.0–100.0)
Monocytes Absolute: 0.4 10*3/uL (ref 0.1–1.0)
Monocytes Relative: 9 %
Neutro Abs: 2.5 10*3/uL (ref 1.7–7.7)
Neutrophils Relative %: 50 %
Platelet Count: 229 10*3/uL (ref 150–400)
RBC: 4.71 MIL/uL (ref 3.87–5.11)
RDW: 12.1 % (ref 11.5–15.5)
WBC Count: 4.9 10*3/uL (ref 4.0–10.5)
nRBC: 0 % (ref 0.0–0.2)

## 2022-03-18 LAB — IRON AND IRON BINDING CAPACITY (CC-WL,HP ONLY)
Iron: 91 ug/dL (ref 28–170)
Saturation Ratios: 28 % (ref 10.4–31.8)
TIBC: 326 ug/dL (ref 250–450)
UIBC: 235 ug/dL (ref 148–442)

## 2022-03-18 LAB — FERRITIN: Ferritin: 240 ng/mL (ref 11–307)

## 2022-03-18 LAB — VITAMIN B12: Vitamin B-12: 1127 pg/mL — ABNORMAL HIGH (ref 180–914)

## 2022-03-19 ENCOUNTER — Encounter: Payer: Self-pay | Admitting: *Deleted

## 2022-03-26 ENCOUNTER — Inpatient Hospital Stay (HOSPITAL_BASED_OUTPATIENT_CLINIC_OR_DEPARTMENT_OTHER): Payer: No Typology Code available for payment source | Admitting: Hematology & Oncology

## 2022-03-26 ENCOUNTER — Other Ambulatory Visit: Payer: Self-pay

## 2022-03-26 ENCOUNTER — Encounter: Payer: Self-pay | Admitting: Hematology & Oncology

## 2022-03-26 VITALS — BP 91/54 | HR 80 | Temp 98.3°F | Resp 18 | Ht 65.0 in | Wt 119.0 lb

## 2022-03-26 DIAGNOSIS — D509 Iron deficiency anemia, unspecified: Secondary | ICD-10-CM | POA: Diagnosis not present

## 2022-03-26 DIAGNOSIS — K6389 Other specified diseases of intestine: Secondary | ICD-10-CM | POA: Diagnosis not present

## 2022-03-26 NOTE — Progress Notes (Signed)
Hematology and Oncology Follow Up Visit  Ann Calhoun [id number] 19-Feb-1966 56 y.o. 03/26/2022   Principle Diagnosis:  Iron deficiency anemia  Current Therapy:   IV iron-Monoferric given on 06/30/2021     Interim History:  Ann Calhoun is back for follow-up.  She is worried about some problems with weight loss.  She has seen endocrinology for this.  Endocrinology thought there might be an issue with her pancreas.  They want to refer her to Gastroenterology.  As far as her iron goes, she is done well with this.  We do not give her IV iron for 8 months.  She had lab work done a week ago.  At that time, her iron studies showed a ferritin of 240 with an iron saturation of 28%.  This is being maintained.  She is worried about her high vitamin B12 level.  Her level a week ago was 1127.  Again I told her that no one has ever gotten sick or has any problems with high vitamin B12.  She has had some abdominal pain.  This is in the upper left epigastric region.  There is no vomiting.  She has had no cough or shortness of breath.  She has had no rashes.  She has had no bleeding.  She has had no fever.  Overall, her performance status right now is ECOG 1.     Medications:  Current Outpatient Medications:    Ascorbic Acid (VITAMIN C WITH ROSE HIPS) 1000 MG tablet, Take 500 mg by mouth daily., Disp: , Rfl:    CYTOMEL 5 MCG tablet, Take 2 tablets by mouth daily., Disp: , Rfl:    melatonin 3 MG TABS tablet, Take 3 mg by mouth at bedtime., Disp: , Rfl:    OVER THE COUNTER MEDICATION, Magnesium Glycinate '150mg'$   2 pills daily at bedtime, Disp: , Rfl:    OVER THE COUNTER MEDICATION, Magnesium citrate '270mg'$  Sig: 1-2 pills daily at dinner, Disp: , Rfl:    PROGESTERONE MICRONIZED PO, Take 100 mg by mouth daily. Compounded pill, Disp: , Rfl:    TIROSINT 50 MCG CAPS, Take 50 mcg by mouth daily., Disp: , Rfl:    VITAMIN D, CHOLECALCIFEROL, PO, Take 1 tablet by mouth daily. Takes 2500 IU daily., Disp:  , Rfl:    VIVELLE-DOT 0.05 MG/24HR patch, 1 patch 2 (two) times a week., Disp: , Rfl:    ZINC GLUCONATE PO, Take 22 mg by mouth daily., Disp: , Rfl:   Allergies:  Allergies  Allergen Reactions   Nickel Rash   Sulfa Antibiotics Hives, Itching and Other (See Comments)    Past Medical History, Surgical history, Social history, and Family History were reviewed and updated.  Review of Systems: Review of Systems  Constitutional: Negative.   HENT:  Negative.    Eyes: Negative.   Respiratory: Negative.    Cardiovascular: Negative.   Gastrointestinal: Negative.   Endocrine: Negative.   Genitourinary: Negative.    Musculoskeletal: Negative.   Skin: Negative.   Neurological: Negative.   Hematological: Negative.   Psychiatric/Behavioral: Negative.      Physical Exam:  height is '5\' 5"'$  (1.651 m) and weight is 119 lb (54 kg). Her oral temperature is 98.3 F (36.8 C). Her blood pressure is 91/54 (abnormal) and her pulse is 80. Her respiration is 18 and oxygen saturation is 99%.   Wt Readings from Last 3 Encounters:  03/26/22 119 lb (54 kg)  02/19/22 118 lb (53.5 kg)  02/18/22 117 lb (53.1 kg)  Physical Exam Vitals reviewed.  HENT:     Head: Normocephalic and atraumatic.  Eyes:     Pupils: Pupils are equal, round, and reactive to light.  Cardiovascular:     Rate and Rhythm: Normal rate and regular rhythm.     Heart sounds: Normal heart sounds.  Pulmonary:     Effort: Pulmonary effort is normal.     Breath sounds: Normal breath sounds.  Abdominal:     General: Bowel sounds are normal.     Palpations: Abdomen is soft.  Musculoskeletal:        General: No tenderness or deformity. Normal range of motion.     Cervical back: Normal range of motion.  Lymphadenopathy:     Cervical: No cervical adenopathy.  Skin:    General: Skin is warm and dry.     Findings: No erythema or rash.  Neurological:     Mental Status: She is alert and oriented to person, place, and time.   Psychiatric:        Behavior: Behavior normal.        Thought Content: Thought content normal.        Judgment: Judgment normal.     Lab Results  Component Value Date   WBC 4.9 03/18/2022   HGB 13.6 03/18/2022   HCT 42.3 03/18/2022   MCV 89.8 03/18/2022   PLT 229 03/18/2022     Chemistry      Component Value Date/Time   NA 138 03/18/2022 0911   K 4.3 03/18/2022 0911   CL 103 03/18/2022 0911   CO2 30 03/18/2022 0911   BUN 22 (H) 03/18/2022 0911   CREATININE 0.73 03/18/2022 0911      Component Value Date/Time   CALCIUM 9.6 03/18/2022 0911   ALKPHOS 67 03/18/2022 0911   AST 17 03/18/2022 0911   ALT 22 03/18/2022 0911   BILITOT 0.5 03/18/2022 0911       Impression and Plan: Ann Calhoun is a very nice 56 year old white female.  It is always a lot a fun talking to her.    I am not sure what to make of the weight loss.  I think this is some that is out of our area.  I do not think it has any to do with her iron metabolism.  She is worried about pancreatic insufficiency.  I think this is where Gastroenterology can come in.  To try to get everything started, I will order a CT of the pancreas.  I think this would be a very reasonable start.  Again, her iron levels have been doing good.  Her hemoglobin is been holding stable.  I think we probably get her back in about 2 months or so.  Hopefully, there will be some resolution with respect to any kind of pancreatic problem.    Volanda Napoleon, MD 7/28/20234:02 PM

## 2022-04-07 ENCOUNTER — Ambulatory Visit (HOSPITAL_COMMUNITY)
Admission: RE | Admit: 2022-04-07 | Discharge: 2022-04-07 | Disposition: A | Discharge status: Home / Self Care | Payer: PRIVATE HEALTH INSURANCE | Source: Ambulatory Visit | Attending: Hematology & Oncology | Admitting: Hematology & Oncology

## 2022-04-07 ENCOUNTER — Encounter (HOSPITAL_COMMUNITY): Payer: Self-pay

## 2022-04-07 DIAGNOSIS — K6389 Other specified diseases of intestine: Secondary | ICD-10-CM | POA: Insufficient documentation

## 2022-04-07 MED ORDER — IOHEXOL 300 MG/ML  SOLN
100.0000 mL | Freq: Once | INTRAMUSCULAR | Status: AC | PRN
  Administered 2022-04-07: 100 mL via INTRAVENOUS

## 2022-04-07 MED ORDER — SODIUM CHLORIDE (PF) 0.9 % IJ SOLN
INTRAMUSCULAR | Status: AC
  Filled 2022-04-07: qty 50

## 2022-04-08 ENCOUNTER — Encounter: Payer: Self-pay | Admitting: *Deleted

## 2022-05-17 ENCOUNTER — Encounter: Payer: Self-pay | Admitting: Hematology & Oncology

## 2022-05-25 ENCOUNTER — Encounter: Payer: Self-pay | Admitting: Hematology & Oncology

## 2022-06-01 ENCOUNTER — Inpatient Hospital Stay: Payer: No Typology Code available for payment source | Attending: Hematology & Oncology

## 2022-06-01 ENCOUNTER — Telehealth: Payer: Self-pay

## 2022-06-01 DIAGNOSIS — K638219 Small intestinal bacterial overgrowth, unspecified: Secondary | ICD-10-CM

## 2022-06-01 DIAGNOSIS — K8689 Other specified diseases of pancreas: Secondary | ICD-10-CM | POA: Insufficient documentation

## 2022-06-01 DIAGNOSIS — D509 Iron deficiency anemia, unspecified: Secondary | ICD-10-CM | POA: Diagnosis not present

## 2022-06-01 LAB — CMP (CANCER CENTER ONLY)
ALT: 19 U/L (ref 0–44)
AST: 17 U/L (ref 15–41)
Albumin: 4.3 g/dL (ref 3.5–5.0)
Alkaline Phosphatase: 66 U/L (ref 38–126)
Anion gap: 6 (ref 5–15)
BUN: 24 mg/dL — ABNORMAL HIGH (ref 6–20)
CO2: 30 mmol/L (ref 22–32)
Calcium: 9.4 mg/dL (ref 8.9–10.3)
Chloride: 105 mmol/L (ref 98–111)
Creatinine: 0.64 mg/dL (ref 0.44–1.00)
GFR, Estimated: >60 mL/min (ref >60)
Glucose, Bld: 98 mg/dL (ref 70–99)
Potassium: 4.4 mmol/L (ref 3.5–5.1)
Sodium: 141 mmol/L (ref 135–145)
Total Bilirubin: 0.4 mg/dL (ref 0.3–1.2)
Total Protein: 6.8 g/dL (ref 6.5–8.1)

## 2022-06-01 LAB — FERRITIN: Ferritin: 191 ng/mL (ref 11–307)

## 2022-06-01 LAB — CBC WITH DIFFERENTIAL (CANCER CENTER ONLY)
Abs Immature Granulocytes: 0.03 10*3/uL (ref 0.00–0.07)
Basophils Absolute: 0.1 10*3/uL (ref 0.0–0.1)
Basophils Relative: 1 %
Eosinophils Absolute: 0.2 10*3/uL (ref 0.0–0.5)
Eosinophils Relative: 5 %
HCT: 40.9 % (ref 36.0–46.0)
Hemoglobin: 13.1 g/dL (ref 12.0–15.0)
Immature Granulocytes: 1 %
Lymphocytes Relative: 40 %
Lymphs Abs: 2 10*3/uL (ref 0.7–4.0)
MCH: 28.7 pg (ref 26.0–34.0)
MCHC: 32 g/dL (ref 30.0–36.0)
MCV: 89.7 fL (ref 80.0–100.0)
Monocytes Absolute: 0.4 10*3/uL (ref 0.1–1.0)
Monocytes Relative: 8 %
Neutro Abs: 2.2 10*3/uL (ref 1.7–7.7)
Neutrophils Relative %: 45 %
Platelet Count: 251 10*3/uL (ref 150–400)
RBC: 4.56 MIL/uL (ref 3.87–5.11)
RDW: 12.6 % (ref 11.5–15.5)
WBC Count: 4.9 10*3/uL (ref 4.0–10.5)
nRBC: 0 % (ref 0.0–0.2)

## 2022-06-01 LAB — RETICULOCYTES
Immature Retic Fract: 6.5 % (ref 2.3–15.9)
RBC.: 4.55 MIL/uL (ref 3.87–5.11)
Retic Count, Absolute: 59.2 10*3/uL (ref 19.0–186.0)
Retic Ct Pct: 1.3 % (ref 0.4–3.1)

## 2022-06-01 LAB — VITAMIN B12: Vitamin B-12: 393 pg/mL (ref 180–914)

## 2022-06-01 LAB — IRON AND IRON BINDING CAPACITY (CC-WL,HP ONLY)
Iron: 84 ug/dL (ref 28–170)
Saturation Ratios: 26 % (ref 10.4–31.8)
TIBC: 319 ug/dL (ref 250–450)
UIBC: 235 ug/dL (ref 148–442)

## 2022-06-01 NOTE — Telephone Encounter (Signed)
-----   Message from Volanda Napoleon, MD sent at 06/01/2022 12:53 PM EDT ----- Please call and let her know the iron studies look great.  Everything is holding steady.  Thanks.  Laurey Arrow

## 2022-06-09 ENCOUNTER — Ambulatory Visit: Payer: No Typology Code available for payment source | Admitting: Hematology & Oncology

## 2022-07-02 ENCOUNTER — Other Ambulatory Visit: Payer: Self-pay | Admitting: Obstetrics and Gynecology

## 2022-07-02 DIAGNOSIS — Z1231 Encounter for screening mammogram for malignant neoplasm of breast: Secondary | ICD-10-CM

## 2022-08-06 ENCOUNTER — Ambulatory Visit: Payer: No Typology Code available for payment source | Attending: Internal Medicine | Admitting: Internal Medicine

## 2022-08-06 ENCOUNTER — Encounter: Payer: Self-pay | Admitting: Internal Medicine

## 2022-08-06 VITALS — BP 100/70 | HR 99 | Ht 65.0 in | Wt 116.2 lb

## 2022-08-06 DIAGNOSIS — R931 Abnormal findings on diagnostic imaging of heart and coronary circulation: Secondary | ICD-10-CM

## 2022-08-06 DIAGNOSIS — E785 Hyperlipidemia, unspecified: Secondary | ICD-10-CM

## 2022-08-06 DIAGNOSIS — Z5320 Procedure and treatment not carried out because of patient's decision for unspecified reasons: Secondary | ICD-10-CM | POA: Diagnosis not present

## 2022-08-06 NOTE — Patient Instructions (Signed)
Medication Instructions:  NO CHANGES  *If you need a refill on your cardiac medications before your next appointment, please call your pharmacy*    Follow-Up: At [facility], you and your health needs are our priority.  As part of our continuing mission to provide you with exceptional heart care, we have created designated Provider Care Teams.  These Care Teams include your primary Cardiologist (physician) and Advanced Practice Providers (APPs -  Physician Assistants and Nurse Practitioners) who all work together to provide you with the care you need, when you need it.  We recommend signing up for the patient portal called "MyChart".  Sign up information is provided on this After Visit Summary.  MyChart is used to connect with patients for Virtual Visits (Telemedicine).  Patients are able to view lab/test results, encounter notes, upcoming appointments, etc.  Non-urgent messages can be sent to your provider as well.   To learn more about what you can do with MyChart, go to [email or web address].    Your next appointment:    AS NEEDED with Dr. Hilty  

## 2022-08-08 NOTE — Progress Notes (Signed)
LIPID CLINIC CONSULT NOTE  Chief Complaint:  Alternative statin recommendations  Primary Care Physician: Ginger Organ., MD  Primary Cardiologist:  None  HPI:  Ann Calhoun is a 56 y.o. female who is being seen today for the evaluation of dyslipidemia at the request of Ann Pulse [name], MD. this is a pleasant 56 year old female kindly referred for evaluation management of dyslipidemia.  Primarily she is searching for alternative statin recommendations.  Was kindly referred by her after recent calcium scan showed elevated calcium score of 127.5, 94th percentile for age and sex matched controls.  She was also noted to have aortic atherosclerosis.  She was quite concerned with the findings however is quite adamant about not going on to any medical therapies at this time she had a [place] heart profile performed which showed a SLCOB1 variant, which suggests a higher risk of statin myalgia due to decreased metabolism of certain statins, however does not mean that she would not respond to statin therapy, which is what she indicated.  I reviewed her profile, however which overall was pretty reassuring.  Her LDL however still remained in the 120s.  Her most recent repeat LDL however was lower at 98, total cholesterol 176, HDL 66 and triglycerides 58.  She has had a higher HDL level, however we discussed the fact that this may not be cardioprotective due to the fact that her HDL may not be functional, or expressing the appropriate surface proteins to allow reverse cholesterol transport.  Her LP(a) was negative and other markers including her sensitivity CRP was low.  She does not note early onset heart disease family.  She has had a number of medical problems recently including a "pressure dependent neuropathy".  Based on this, she is quite hesitant to add additional medications which may have side effects.  She is also had a number of GI issues.  She was in fact seen by Dr. Gwenlyn Found back in June  2023 for his recommendations regarding her calcium score.  He had mentioned statin therapy but she declined it.  Plan was follow-up as needed.  She has subsequently started a number of supplements and is on some treatments as per Dr. Rosezella Rumpf (who has been accused of fraud by the FDA and FTC) and other Internet physicians with non evidence-based treatment recommendations.  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   Kidney stones    Nephrolithiasis    in her 20's   Osteopenia    Postpartum depression    Vitamin D deficiency     Past Surgical History:  Procedure Laterality Date   37 HOUR [place] STUDY N/A 06/25/2020   Procedure: 24 HOUR [place] STUDY;  Surgeon: Gatha Mayer, MD;  Location: [facility];  Service: Endoscopy;  Laterality: N/A;   BREAST BIOPSY Left 2015   benign   BREAST BIOPSY Left 02/01/2019   CESAREAN SECTION     uterus repair for perforation   COLONOSCOPY  multiple   ESOPHAGEAL MANOMETRY N/A 06/25/2020   Procedure: ESOPHAGEAL MANOMETRY (EM);  Surgeon: Gatha Mayer, MD;  Location: [facility];  Service: Endoscopy;  Laterality: N/A;   KIDNEY STONE SURGERY  1991   got septic afterwards.   UPPER GASTROINTESTINAL ENDOSCOPY  05/12/2021   Bravo capsule placement    FAMHx:  Family History  Problem Relation Age of Onset   Colon polyps Mother    Atrial fibrillation Mother    Hypothyroidism Mother    Colon cancer Maternal Grandfather 92   Breast cancer Maternal Grandmother    Esophageal cancer Maternal Grandmother    Glaucoma Maternal Grandmother    Diabetes Father    Asthma Brother    CVA Paternal Grandmother    Rectal cancer Neg Hx    Stomach cancer Neg Hx     SOCHx:    reports that she quit smoking about 26 years ago. Her smoking use included cigarettes. She has never used smokeless tobacco. She reports current alcohol use of about 4.0 standard drinks of alcohol per week. She reports that she does not use drugs.  ALLERGIES:  Allergies  Allergen Reactions   Nickel Rash   Sulfa Antibiotics Hives, Itching and Other (See Comments)    ROS: Pertinent items noted in HPI and remainder of comprehensive ROS otherwise negative.  HOME MEDS: Current Outpatient Medications on File Prior to Visit  Medication Sig Dispense Refill   Ascorbic Acid (VITAMIN C WITH ROSE HIPS) 1000 MG tablet Take 500 mg by mouth daily.     CYTOMEL 5 MCG tablet Take 2 tablets by mouth daily.     melatonin 3 MG TABS tablet Take 3 mg by mouth at bedtime.     MINIVELLE 0.075 MG/24HR APPLY PATCH AS DIRECTED     OVER THE COUNTER MEDICATION Magnesium Glycinate '150mg'$   2 pills daily at bedtime     OVER THE COUNTER MEDICATION Magnesium citrate '270mg'$  Sig: 1-2 pills daily at dinner     PROGESTERONE MICRONIZED PO Take 100 mg by mouth daily. Compounded pill     TIROSINT 50 MCG CAPS Take 50 mcg by mouth daily.     VITAMIN D, CHOLECALCIFEROL, PO Take 1 tablet by mouth daily. Takes 2500 IU daily.     ZINC GLUCONATE PO Take 22 mg by mouth daily.     VIVELLE-DOT 0.05 MG/24HR patch 1 patch 2 (two) times a week. (Patient not taking: Reported on 08/06/2022)     No current facility-administered medications on file prior to visit.    LABS/IMAGING: No results found for this or any previous visit (from the past 48 hour(s)). No results found.  LIPID PANEL: No results found for: "CHOL", "TRIG", "HDL", "CHOLHDL", "VLDL", "LDLCALC", "LDLDIRECT"  WEIGHTS: Wt Readings from Last 3 Encounters:  08/06/22 116 lb 3.2 oz (52.7 kg)  03/26/22 119 lb (54 kg)  02/19/22 118 lb (53.5 kg)    VITALS: BP 100/70   Pulse 99   Ht '5\' 5"'$  (1.651 m)   Wt 116 lb 3.2 oz (52.7 kg)   LMP 05/01/2014 (Within Months) Comment: Pt  states that she has not had a period in 2 years, then had one ~35-40 days ago.  SpO2 98%   BMI 19.34 kg/m   EXAM: Deferred  EKG: Deferred  ASSESSMENT: Mixed dyslipidemia, goal LDL less than 70 Negative LP(a) SLCOB1 variant, statin hesitant Elevated CAC score of 127.5, 94th percentile Aortic atheroscleroses  PLAN: 1.   Ann Calhoun has an elevated coronary artery calcium score which demonstrates age advanced coronary disease.  This is despite what appears to be a reasonably good [place] lipid profile. She understands that CAD is multifactorial and not just related to a lipid disorder. That being said her  LDL remains elevated and fortunately LP(a) is negative.  Statins would be recommended however she is adamantly against this.  We discussed alternatives such as ezetimibe.  She would not qualify for PCSK9 inhibitors or Nexletol without previously having trialed or failed statin therapy.  There is the possibility to pay for these medications out-of-pocket however that would be likely more than $500 per month.  I did suggest some nonstatin options including Cholest-Off (plant phytoserols) and adding fiber (psyllium or flaxseed oil) to her diet. Given her other medical concerns (GI and neuropathy), I think a clinical trial would not be a great idea since she would be randomized to treatment and the safety profile of a study drug is not entirely worked out.  She agreed with that.  She can follow-up with me as needed.  Ann Casino, MD, [facility], [place] Director of the Advanced Lipid Disorders &  Cardiovascular Risk Reduction Clinic Diplomate of the American Board of Clinical Lipidology Attending Cardiologist  Direct Dial: [phone]  Fax: [phone]  Website:  [email or web address] 08/08/2022, 3:02 PM

## 2022-09-08 ENCOUNTER — Other Ambulatory Visit: Payer: Self-pay

## 2022-09-08 DIAGNOSIS — D501 Sideropenic dysphagia: Secondary | ICD-10-CM

## 2022-09-09 ENCOUNTER — Inpatient Hospital Stay: Payer: No Typology Code available for payment source | Attending: Hematology & Oncology

## 2022-09-09 ENCOUNTER — Ambulatory Visit
Admission: RE | Admit: 2022-09-09 | Discharge: 2022-09-09 | Disposition: A | Discharge status: Home / Self Care | Payer: No Typology Code available for payment source | Source: Ambulatory Visit | Attending: Obstetrics and Gynecology | Admitting: Obstetrics and Gynecology

## 2022-09-09 ENCOUNTER — Encounter: Payer: Self-pay | Admitting: *Deleted

## 2022-09-09 DIAGNOSIS — D501 Sideropenic dysphagia: Secondary | ICD-10-CM

## 2022-09-09 DIAGNOSIS — D509 Iron deficiency anemia, unspecified: Secondary | ICD-10-CM | POA: Insufficient documentation

## 2022-09-09 DIAGNOSIS — Z1231 Encounter for screening mammogram for malignant neoplasm of breast: Secondary | ICD-10-CM

## 2022-09-09 LAB — CBC WITH DIFFERENTIAL (CANCER CENTER ONLY)
Abs Immature Granulocytes: 0.01 10*3/uL (ref 0.00–0.07)
Basophils Absolute: 0.1 10*3/uL (ref 0.0–0.1)
Basophils Relative: 2 %
Eosinophils Absolute: 0.2 10*3/uL (ref 0.0–0.5)
Eosinophils Relative: 3 %
HCT: 42.1 % (ref 36.0–46.0)
Hemoglobin: 13.5 g/dL (ref 12.0–15.0)
Immature Granulocytes: 0 %
Lymphocytes Relative: 44 %
Lymphs Abs: 2 10*3/uL (ref 0.7–4.0)
MCH: 29.2 pg (ref 26.0–34.0)
MCHC: 32.1 g/dL (ref 30.0–36.0)
MCV: 90.9 fL (ref 80.0–100.0)
Monocytes Absolute: 0.5 10*3/uL (ref 0.1–1.0)
Monocytes Relative: 10 %
Neutro Abs: 1.9 10*3/uL (ref 1.7–7.7)
Neutrophils Relative %: 41 %
Platelet Count: 252 10*3/uL (ref 150–400)
RBC: 4.63 MIL/uL (ref 3.87–5.11)
RDW: 12.1 % (ref 11.5–15.5)
WBC Count: 4.6 10*3/uL (ref 4.0–10.5)
nRBC: 0 % (ref 0.0–0.2)

## 2022-09-09 LAB — CMP (CANCER CENTER ONLY)
ALT: 13 U/L (ref 0–44)
AST: 13 U/L — ABNORMAL LOW (ref 15–41)
Albumin: 4.3 g/dL (ref 3.5–5.0)
Alkaline Phosphatase: 73 U/L (ref 38–126)
Anion gap: 5 (ref 5–15)
BUN: 24 mg/dL — ABNORMAL HIGH (ref 6–20)
CO2: 31 mmol/L (ref 22–32)
Calcium: 9.4 mg/dL (ref 8.9–10.3)
Chloride: 102 mmol/L (ref 98–111)
Creatinine: 0.63 mg/dL (ref 0.44–1.00)
GFR, Estimated: >60 mL/min (ref >60)
Glucose, Bld: 95 mg/dL (ref 70–99)
Potassium: 4.3 mmol/L (ref 3.5–5.1)
Sodium: 138 mmol/L (ref 135–145)
Total Bilirubin: 0.5 mg/dL (ref 0.3–1.2)
Total Protein: 6.9 g/dL (ref 6.5–8.1)

## 2022-09-09 LAB — IRON AND IRON BINDING CAPACITY (CC-WL,HP ONLY)
Iron: 90 ug/dL (ref 28–170)
Saturation Ratios: 28 % (ref 10.4–31.8)
TIBC: 321 ug/dL (ref 250–450)
UIBC: 231 ug/dL (ref 148–442)

## 2022-09-09 LAB — RETICULOCYTES
Immature Retic Fract: 5.3 % (ref 2.3–15.9)
RBC.: 4.66 MIL/uL (ref 3.87–5.11)
Retic Count, Absolute: 65.7 10*3/uL (ref 19.0–186.0)
Retic Ct Pct: 1.4 % (ref 0.4–3.1)

## 2022-09-09 LAB — FERRITIN: Ferritin: 165 ng/mL (ref 11–307)

## 2022-09-09 LAB — VITAMIN B12: Vitamin B-12: 399 pg/mL (ref 180–914)

## 2022-09-13 ENCOUNTER — Ambulatory Visit: Payer: No Typology Code available for payment source | Admitting: Hematology & Oncology

## 2022-09-13 ENCOUNTER — Inpatient Hospital Stay (HOSPITAL_BASED_OUTPATIENT_CLINIC_OR_DEPARTMENT_OTHER): Payer: No Typology Code available for payment source | Admitting: Hematology & Oncology

## 2022-09-13 ENCOUNTER — Other Ambulatory Visit: Payer: Self-pay

## 2022-09-13 ENCOUNTER — Encounter: Payer: Self-pay | Admitting: Hematology & Oncology

## 2022-09-13 VITALS — BP 91/56 | HR 70 | Temp 97.8°F | Resp 18 | Ht 65.0 in | Wt 116.4 lb

## 2022-09-13 DIAGNOSIS — D500 Iron deficiency anemia secondary to blood loss (chronic): Secondary | ICD-10-CM | POA: Diagnosis not present

## 2022-09-13 DIAGNOSIS — D509 Iron deficiency anemia, unspecified: Secondary | ICD-10-CM | POA: Diagnosis not present

## 2022-09-13 NOTE — Progress Notes (Addendum)
Hematology and Oncology Follow Up Visit  Ann Calhoun [id number] 01/06/1966 57 y.o. 09/13/2022   Principle Diagnosis:  Iron deficiency anemia  Current Therapy:   IV iron-Monoferric given on 06/30/2021     Interim History:  Ann Calhoun is back for follow-up.  We last saw her back in July.  She been doing pretty well.  She did go to [place] for Christmas.  She is from [place].  She was quite busy last year.  She is busy traveling.  Hopefully, this year things will slow down for a little bit.  She has seen Gastroenterology.  She has been through a very thorough workup with them.    She did have a CT of the pancreas back in August of last year.  This was relatively unremarkable.  She is holding her weight which is a nice thing.  So far, iron has not been a problem for her.  She has maintained her iron stores.  When we last checked them just last week, her ferritin was 165 with an iron saturation of 28%.  She has hurt her right shoulder.  I think she has some degenerative changes.  She has had a injection.  She is on some physical therapy for the shoulder today.  She apparently has a high cardiac calcium score.  She is being followed by cardiology for this.  She has had no change in bowel or bladder habits.  Overall, I would say performance status is probably ECOG 1.     Medications:  Current Outpatient Medications:    Ascorbic Acid (VITAMIN C WITH ROSE HIPS) 1000 MG tablet, Take 500 mg by mouth daily., Disp: , Rfl:    CYTOMEL 5 MCG tablet, Take 2 tablets by mouth daily., Disp: , Rfl:    melatonin 3 MG TABS tablet, Take 3 mg by mouth at bedtime., Disp: , Rfl:    MINIVELLE 0.075 MG/24HR, APPLY PATCH AS DIRECTED, Disp: , Rfl:    OVER THE COUNTER MEDICATION, Magnesium Glycinate '150mg'$   2 pills daily at bedtime, Disp: , Rfl:    OVER THE COUNTER MEDICATION, Magnesium citrate '270mg'$  Sig: 1-2 pills daily at dinner, Disp: , Rfl:    PROGESTERONE MICRONIZED PO, Take 100 mg by mouth  daily. Compounded pill, Disp: , Rfl:    TIROSINT 50 MCG CAPS, Take 50 mcg by mouth daily., Disp: , Rfl:    VITAMIN D, CHOLECALCIFEROL, PO, Take 1 tablet by mouth daily. Takes 2500 IU daily., Disp: , Rfl:    ZINC GLUCONATE PO, Take 22 mg by mouth daily., Disp: , Rfl:   Allergies:  Allergies  Allergen Reactions   Nickel Rash   Sulfa Antibiotics Hives, Itching and Other (See Comments)    Past Medical History, Surgical history, Social history, and Family History were reviewed and updated.  Review of Systems: Review of Systems  Constitutional: Negative.   HENT:  Negative.    Eyes: Negative.   Respiratory: Negative.    Cardiovascular: Negative.   Gastrointestinal: Negative.   Endocrine: Negative.   Genitourinary: Negative.    Musculoskeletal: Negative.   Skin: Negative.   Neurological: Negative.   Hematological: Negative.   Psychiatric/Behavioral: Negative.      Physical Exam:  height is '5\' 5"'$  (1.651 m) and weight is 116 lb 6.4 oz (52.8 kg). Her oral temperature is 97.8 F (36.6 C). Her blood pressure is 91/56 (abnormal) and her pulse is 70. Her respiration is 18 and oxygen saturation is 100%.   Wt Readings from Last 3 Encounters:  09/13/22 116 lb 6.4 oz (52.8 kg)  08/06/22 116 lb 3.2 oz (52.7 kg)  03/26/22 119 lb (54 kg)    Physical Exam Vitals reviewed.  HENT:     Head: Normocephalic and atraumatic.  Eyes:     Pupils: Pupils are equal, round, and reactive to light.  Cardiovascular:     Rate and Rhythm: Normal rate and regular rhythm.     Heart sounds: Normal heart sounds.  Pulmonary:     Effort: Pulmonary effort is normal.     Breath sounds: Normal breath sounds.  Abdominal:     General: Bowel sounds are normal.     Palpations: Abdomen is soft.  Musculoskeletal:        General: No tenderness or deformity. Normal range of motion.     Cervical back: Normal range of motion.  Lymphadenopathy:     Cervical: No cervical adenopathy.  Skin:    General: Skin is warm  and dry.     Findings: No erythema or rash.  Neurological:     Mental Status: She is alert and oriented to person, place, and time.  Psychiatric:        Behavior: Behavior normal.        Thought Content: Thought content normal.        Judgment: Judgment normal.     Lab Results  Component Value Date   WBC 4.6 09/09/2022   HGB 13.5 09/09/2022   HCT 42.1 09/09/2022   MCV 90.9 09/09/2022   PLT 252 09/09/2022     Chemistry      Component Value Date/Time   NA 138 09/09/2022 0920   K 4.3 09/09/2022 0920   CL 102 09/09/2022 0920   CO2 31 09/09/2022 0920   BUN 24 (H) 09/09/2022 0920   CREATININE 0.63 09/09/2022 0920      Component Value Date/Time   CALCIUM 9.4 09/09/2022 0920   ALKPHOS 73 09/09/2022 0920   AST 13 (L) 09/09/2022 0920   ALT 13 09/09/2022 0920   BILITOT 0.5 09/09/2022 0920       Impression and Plan: Ann Calhoun is a very nice 57 year old white female.  It is always a lot a fun talking to her.    I am glad that her iron has been doing well.  At least, she is not losing iron from what I can tell.  Hopefully, Gastroenterology will be able to help with any issues with respect to malabsorption.  Am happy that the CT scan looks fine.  I would like to get her back in about 4 months now.  We will get her through the Winter.  We will check her labs a week before I see her back.   Volanda Napoleon, MD 1/15/202411:01 AM

## 2023-01-05 ENCOUNTER — Other Ambulatory Visit: Payer: No Typology Code available for payment source

## 2023-01-05 ENCOUNTER — Other Ambulatory Visit: Payer: Self-pay

## 2023-01-05 DIAGNOSIS — R202 Paresthesia of skin: Secondary | ICD-10-CM

## 2023-01-06 ENCOUNTER — Inpatient Hospital Stay: Payer: No Typology Code available for payment source | Attending: Hematology & Oncology

## 2023-01-06 ENCOUNTER — Encounter: Payer: Self-pay | Admitting: Hematology & Oncology

## 2023-01-06 ENCOUNTER — Encounter: Payer: Self-pay | Admitting: *Deleted

## 2023-01-06 DIAGNOSIS — R202 Paresthesia of skin: Secondary | ICD-10-CM

## 2023-01-06 DIAGNOSIS — D509 Iron deficiency anemia, unspecified: Secondary | ICD-10-CM | POA: Insufficient documentation

## 2023-01-06 DIAGNOSIS — D500 Iron deficiency anemia secondary to blood loss (chronic): Secondary | ICD-10-CM

## 2023-01-06 LAB — RETICULOCYTES
Immature Retic Fract: 5 % (ref 2.3–15.9)
RBC.: 4.55 MIL/uL (ref 3.87–5.11)
Retic Count, Absolute: 57.3 10*3/uL (ref 19.0–186.0)
Retic Ct Pct: 1.3 % (ref 0.4–3.1)

## 2023-01-06 LAB — CMP (CANCER CENTER ONLY)
ALT: 12 U/L (ref 0–44)
AST: 15 U/L (ref 15–41)
Albumin: 4.3 g/dL (ref 3.5–5.0)
Alkaline Phosphatase: 67 U/L (ref 38–126)
Anion gap: 8 (ref 5–15)
BUN: 25 mg/dL — ABNORMAL HIGH (ref 6–20)
CO2: 28 mmol/L (ref 22–32)
Calcium: 9.2 mg/dL (ref 8.9–10.3)
Chloride: 102 mmol/L (ref 98–111)
Creatinine: 0.69 mg/dL (ref 0.44–1.00)
GFR, Estimated: >60 mL/min (ref >60)
Glucose, Bld: 96 mg/dL (ref 70–99)
Potassium: 4.4 mmol/L (ref 3.5–5.1)
Sodium: 138 mmol/L (ref 135–145)
Total Bilirubin: 0.4 mg/dL (ref 0.3–1.2)
Total Protein: 6.8 g/dL (ref 6.5–8.1)

## 2023-01-06 LAB — CBC WITH DIFFERENTIAL (CANCER CENTER ONLY)
Abs Immature Granulocytes: 0.01 10*3/uL (ref 0.00–0.07)
Basophils Absolute: 0.1 10*3/uL (ref 0.0–0.1)
Basophils Relative: 1 %
Eosinophils Absolute: 0.2 10*3/uL (ref 0.0–0.5)
Eosinophils Relative: 4 %
HCT: 41.6 % (ref 36.0–46.0)
Hemoglobin: 13.4 g/dL (ref 12.0–15.0)
Immature Granulocytes: 0 %
Lymphocytes Relative: 40 %
Lymphs Abs: 1.9 10*3/uL (ref 0.7–4.0)
MCH: 28.8 pg (ref 26.0–34.0)
MCHC: 32.2 g/dL (ref 30.0–36.0)
MCV: 89.5 fL (ref 80.0–100.0)
Monocytes Absolute: 0.4 10*3/uL (ref 0.1–1.0)
Monocytes Relative: 9 %
Neutro Abs: 2.1 10*3/uL (ref 1.7–7.7)
Neutrophils Relative %: 46 %
Platelet Count: 222 10*3/uL (ref 150–400)
RBC: 4.65 MIL/uL (ref 3.87–5.11)
RDW: 12 % (ref 11.5–15.5)
WBC Count: 4.6 10*3/uL (ref 4.0–10.5)
nRBC: 0 % (ref 0.0–0.2)

## 2023-01-06 LAB — IRON AND IRON BINDING CAPACITY (CC-WL,HP ONLY)
Iron: 55 ug/dL (ref 28–170)
Saturation Ratios: 18 % (ref 10.4–31.8)
TIBC: 308 ug/dL (ref 250–450)
UIBC: 253 ug/dL (ref 148–442)

## 2023-01-06 LAB — VITAMIN D 25 HYDROXY (VIT D DEFICIENCY, FRACTURES): Vit D, 25-Hydroxy: 58.75 ng/mL (ref 30–100)

## 2023-01-06 LAB — LACTATE DEHYDROGENASE: LDH: 119 U/L (ref 98–192)

## 2023-01-06 LAB — VITAMIN B12: Vitamin B-12: 451 pg/mL (ref 180–914)

## 2023-01-06 LAB — FERRITIN: Ferritin: 108 ng/mL (ref 11–307)

## 2023-01-07 ENCOUNTER — Encounter: Payer: Self-pay | Admitting: *Deleted

## 2023-01-07 LAB — PTH, INTACT AND CALCIUM
Calcium, Total (PTH): 9 mg/dL (ref 8.7–10.2)
PTH: 38 pg/mL (ref 15–65)

## 2023-01-08 LAB — CALCIUM, IONIZED: Calcium, Ionized, Serum: 5 mg/dL (ref 4.5–5.6)

## 2023-01-11 LAB — VITAMIN B6: Vitamin B6: 9.6 ug/L (ref 3.4–65.2)

## 2023-01-12 ENCOUNTER — Inpatient Hospital Stay (HOSPITAL_BASED_OUTPATIENT_CLINIC_OR_DEPARTMENT_OTHER): Payer: No Typology Code available for payment source | Admitting: Hematology & Oncology

## 2023-01-12 ENCOUNTER — Other Ambulatory Visit: Payer: Self-pay

## 2023-01-12 ENCOUNTER — Encounter: Payer: Self-pay | Admitting: Hematology & Oncology

## 2023-01-12 ENCOUNTER — Telehealth: Payer: Self-pay

## 2023-01-12 VITALS — BP 103/40 | HR 68 | Temp 99.0°F | Resp 19 | Ht 65.5 in | Wt 121.1 lb

## 2023-01-12 DIAGNOSIS — D500 Iron deficiency anemia secondary to blood loss (chronic): Secondary | ICD-10-CM

## 2023-01-12 DIAGNOSIS — D509 Iron deficiency anemia, unspecified: Secondary | ICD-10-CM | POA: Diagnosis not present

## 2023-01-12 NOTE — Telephone Encounter (Signed)
I spoke to her.  We will observe and I will contact her tomorrow to see how she is doing unless it gets much worse she can contact us sooner.

## 2023-01-12 NOTE — Progress Notes (Signed)
Hematology and Oncology Follow Up Visit  SONAYA RESSEGUIE [id number] 16-May-1966 57 y.o. 01/12/2023   Principle Diagnosis:  Iron deficiency anemia  Current Therapy:   IV iron-Monoferric given on 06/30/2021     Interim History:  Ms. Manger is back for follow-up.  She seemed to be doing fairly well.  She does have some health issues.  She is worried about having another bout of diverticulitis.  She is going to see gastroenterology later on today.  She will so that we will give her some antibiotic so that she does not have a flareup.  She has had some of the night sweats.  She is try to get her hormones regulated by her gynecologist.  I would have to think that the night sweats are probably related to her hormones.  I do not think there is anything related to an underlying malignancy.  She is little bit worried about her iron level.  When we did her lab work on 01/06/2023, her ferritin was 108 with an iron saturation of 18%.  She is worried about the iron saturation going down compared to what it was back in January.  We certainly can give her dose of IV iron.  I know this worked for her before.  She will be going down to [place] I think right after [facility] Day for a little bit of a vacation.  Her daughter will be having her senior year and college down in [place].  They will be taking her down I think in August.  She has had a decent appetite.  She has had no nausea or vomiting.  She has had no rashes.  There is been no leg swelling.  Overall, I would say performance status is probably ECOG 0.  .     Medications:  Current Outpatient Medications:    Ascorbic Acid (VITAMIN C WITH ROSE HIPS) 1000 MG tablet, Take 500 mg by mouth daily., Disp: , Rfl:    ascorbic acid (VITAMIN C) 500 MG tablet, Take 500 mg by mouth daily., Disp: , Rfl:    Cholecalciferol (VITAMIN D3 PO), Take by mouth., Disp: , Rfl:    CYTOMEL 5 MCG tablet, Take 2 tablets by mouth daily., Disp: , Rfl:     melatonin 3 MG TABS tablet, Take 3 mg by mouth at bedtime., Disp: , Rfl:    MINIVELLE 0.075 MG/24HR, APPLY PATCH AS DIRECTED, Disp: , Rfl:    OVER THE COUNTER MEDICATION, Magnesium Glycinate 150mg   2 pills daily at bedtime, Disp: , Rfl:    OVER THE COUNTER MEDICATION, Magnesium citrate 270mg  Sig: 1-2 pills daily at dinner, Disp: , Rfl:    PROGESTERONE MICRONIZED PO, Take 100 mg by mouth daily. Compounded pill, Disp: , Rfl:    TIROSINT 50 MCG CAPS, Take 50 mcg by mouth daily., Disp: , Rfl:    VITAMIN D, CHOLECALCIFEROL, PO, Take 1 tablet by mouth daily. Takes 2500 IU daily., Disp: , Rfl:    ZINC GLUCONATE PO, Take 22 mg by mouth daily., Disp: , Rfl:   Allergies:  Allergies  Allergen Reactions   Nickel Rash and Other (See Comments)   Sulfa Antibiotics Hives, Itching and Other (See Comments)    Past Medical History, Surgical history, Social history, and Family History were reviewed and updated.  Review of Systems: Review of Systems  Constitutional: Negative.   HENT:  Negative.    Eyes: Negative.   Respiratory: Negative.    Cardiovascular: Negative.   Gastrointestinal: Negative.   Endocrine: Negative.  Genitourinary: Negative.    Musculoskeletal: Negative.   Skin: Negative.   Neurological: Negative.   Hematological: Negative.   Psychiatric/Behavioral: Negative.      Physical Exam:  height is 5' 5.5" (1.664 m) and weight is 121 lb 1.9 oz (54.9 kg). Her temperature is 99 F (37.2 C). Her blood pressure is 103/40 (abnormal) and her pulse is 68. Her respiration is 19 and oxygen saturation is 100%.   Wt Readings from Last 3 Encounters:  01/12/23 121 lb 1.9 oz (54.9 kg)  09/13/22 116 lb 6.4 oz (52.8 kg)  08/06/22 116 lb 3.2 oz (52.7 kg)    Physical Exam Vitals reviewed.  HENT:     Head: Normocephalic and atraumatic.  Eyes:     Pupils: Pupils are equal, round, and reactive to light.  Cardiovascular:     Rate and Rhythm: Normal rate and regular rhythm.     Heart sounds:  Normal heart sounds.  Pulmonary:     Effort: Pulmonary effort is normal.     Breath sounds: Normal breath sounds.  Abdominal:     General: Bowel sounds are normal.     Palpations: Abdomen is soft.  Musculoskeletal:        General: No tenderness or deformity. Normal range of motion.     Cervical back: Normal range of motion.  Lymphadenopathy:     Cervical: No cervical adenopathy.  Skin:    General: Skin is warm and dry.     Findings: No erythema or rash.  Neurological:     Mental Status: She is alert and oriented to person, place, and time.  Psychiatric:        Behavior: Behavior normal.        Thought Content: Thought content normal.        Judgment: Judgment normal.      Lab Results  Component Value Date   WBC 4.6 01/06/2023   HGB 13.4 01/06/2023   HCT 41.6 01/06/2023   MCV 89.5 01/06/2023   PLT 222 01/06/2023     Chemistry      Component Value Date/Time   NA 138 01/06/2023 0901   K 4.4 01/06/2023 0901   CL 102 01/06/2023 0901   CO2 28 01/06/2023 0901   BUN 25 (H) 01/06/2023 0901   CREATININE 0.69 01/06/2023 0901      Component Value Date/Time   CALCIUM 9.0 01/06/2023 0901   CALCIUM 9.2 01/06/2023 0901   ALKPHOS 67 01/06/2023 0901   AST 15 01/06/2023 0901   ALT 12 01/06/2023 0901   BILITOT 0.4 01/06/2023 0901       Impression and Plan: Ms. Gosdin is a very nice 57 year old white female.  It is always a lot a fun talking to her.    We will see about giving her another dose of iron.  Again, her iron levels are not that low but yet, they are trending downward.  I would like to try to be proactive.  She is worried about getting the Monoferric as she is all that her iron levels got too high.  We can switch her over to [facility].  Hopefully, she does not have a problem with diverticulitis.  I would like to see if we can get her back to see [place] in another 6 weeks or so.  We will have her come back for lab work only in about 3 weeks.   Josph Macho,  MD 5/15/20243:50 PM

## 2023-01-12 NOTE — Telephone Encounter (Signed)
Pt came to third floor waiting room and requested to speak with a nurse in regard to what she thinks to be a diverticulitis flare. Chart was reviewed. Pt was seen on third floor and discussed symptoms. Pt stated that she had been feeling fine until this AM. Pt stated that she was awoke from her sleep with Constant LLQ abdominal pain, had 2 BM 30 minutes apart, 2nd BM was stringy like. Temp this AM 99.0. Intermediate pain now to LLQ.  Pt questioning is she should start antibiotic now or wait until symptoms increase: Please advise

## 2023-01-13 MED ORDER — AMOXICILLIN-POT CLAVULANATE 875-125 MG PO TABS: 1.0000 | ORAL_TABLET | Freq: Two times a day (BID) | ORAL | 0 refills | Status: DC

## 2023-01-13 NOTE — Telephone Encounter (Signed)
Symptoms are resolved  I have Rxed augmentin to have on hand if she does have sxs c/w diverticulitis flare

## 2023-01-13 NOTE — Addendum Note (Signed)
Addended by: Stan Head E on: 01/13/2023 11:17 AM   Modules accepted: Orders

## 2023-01-17 ENCOUNTER — Inpatient Hospital Stay: Payer: No Typology Code available for payment source

## 2023-01-18 ENCOUNTER — Inpatient Hospital Stay: Payer: No Typology Code available for payment source

## 2023-01-18 VITALS — BP 91/49 | HR 75 | Temp 97.9°F | Resp 18

## 2023-01-18 DIAGNOSIS — D509 Iron deficiency anemia, unspecified: Secondary | ICD-10-CM | POA: Diagnosis not present

## 2023-01-18 DIAGNOSIS — K909 Intestinal malabsorption, unspecified: Secondary | ICD-10-CM

## 2023-01-18 DIAGNOSIS — D500 Iron deficiency anemia secondary to blood loss (chronic): Secondary | ICD-10-CM

## 2023-01-18 MED ORDER — SODIUM CHLORIDE 0.9 % IV SOLN: Freq: Once | INTRAVENOUS | Status: AC

## 2023-01-18 MED ORDER — LORATADINE 10 MG PO TABS
10.0000 mg | ORAL_TABLET | Freq: Once | ORAL | Status: AC
  Administered 2023-01-18: 10 mg via ORAL
  Filled 2023-01-18: qty 1

## 2023-01-18 MED ORDER — SODIUM CHLORIDE 0.9 % IV SOLN
510.0000 mg | Freq: Once | INTRAVENOUS | Status: AC
  Administered 2023-01-18: 510 mg via INTRAVENOUS
  Filled 2023-01-18: qty 17

## 2023-01-18 MED ORDER — ACETAMINOPHEN 325 MG PO TABS
650.0000 mg | ORAL_TABLET | Freq: Once | ORAL | Status: AC
  Administered 2023-01-18: 650 mg via ORAL
  Filled 2023-01-18: qty 2

## 2023-01-18 NOTE — Patient Instructions (Signed)

## 2023-02-07 ENCOUNTER — Encounter (HOSPITAL_BASED_OUTPATIENT_CLINIC_OR_DEPARTMENT_OTHER): Payer: Self-pay

## 2023-02-07 DIAGNOSIS — G4730 Sleep apnea, unspecified: Secondary | ICD-10-CM

## 2023-02-16 ENCOUNTER — Other Ambulatory Visit: Payer: Self-pay | Admitting: Endocrinology

## 2023-02-16 DIAGNOSIS — E041 Nontoxic single thyroid nodule: Secondary | ICD-10-CM

## 2023-02-25 ENCOUNTER — Encounter (HOSPITAL_BASED_OUTPATIENT_CLINIC_OR_DEPARTMENT_OTHER): Payer: No Typology Code available for payment source | Admitting: Internal Medicine

## 2023-03-01 ENCOUNTER — Encounter: Payer: Self-pay | Admitting: Hematology & Oncology

## 2023-03-01 ENCOUNTER — Inpatient Hospital Stay (HOSPITAL_BASED_OUTPATIENT_CLINIC_OR_DEPARTMENT_OTHER): Payer: PRIVATE HEALTH INSURANCE | Admitting: Hematology & Oncology

## 2023-03-01 ENCOUNTER — Other Ambulatory Visit: Payer: Self-pay

## 2023-03-01 ENCOUNTER — Inpatient Hospital Stay: Payer: PRIVATE HEALTH INSURANCE | Attending: Hematology & Oncology

## 2023-03-01 VITALS — BP 94/54 | HR 72 | Temp 98.9°F | Resp 17 | Ht 65.5 in | Wt 125.0 lb

## 2023-03-01 DIAGNOSIS — D500 Iron deficiency anemia secondary to blood loss (chronic): Secondary | ICD-10-CM | POA: Diagnosis not present

## 2023-03-01 DIAGNOSIS — D509 Iron deficiency anemia, unspecified: Secondary | ICD-10-CM | POA: Insufficient documentation

## 2023-03-01 DIAGNOSIS — R61 Generalized hyperhidrosis: Secondary | ICD-10-CM | POA: Insufficient documentation

## 2023-03-01 DIAGNOSIS — E7800 Pure hypercholesterolemia, unspecified: Secondary | ICD-10-CM

## 2023-03-01 LAB — CBC WITH DIFFERENTIAL (CANCER CENTER ONLY)
Abs Immature Granulocytes: 0.05 10*3/uL (ref 0.00–0.07)
Basophils Absolute: 0.1 10*3/uL (ref 0.0–0.1)
Basophils Relative: 1 %
Eosinophils Absolute: 0.2 10*3/uL (ref 0.0–0.5)
Eosinophils Relative: 4 %
HCT: 39.9 % (ref 36.0–46.0)
Hemoglobin: 12.8 g/dL (ref 12.0–15.0)
Immature Granulocytes: 1 %
Lymphocytes Relative: 27 %
Lymphs Abs: 1.5 10*3/uL (ref 0.7–4.0)
MCH: 29.7 pg (ref 26.0–34.0)
MCHC: 32.1 g/dL (ref 30.0–36.0)
MCV: 92.6 fL (ref 80.0–100.0)
Monocytes Absolute: 0.6 10*3/uL (ref 0.1–1.0)
Monocytes Relative: 10 %
Neutro Abs: 3.1 10*3/uL (ref 1.7–7.7)
Neutrophils Relative %: 57 %
Platelet Count: 224 10*3/uL (ref 150–400)
RBC: 4.31 MIL/uL (ref 3.87–5.11)
RDW: 12.2 % (ref 11.5–15.5)
WBC Count: 5.4 10*3/uL (ref 4.0–10.5)
nRBC: 0 % (ref 0.0–0.2)

## 2023-03-01 LAB — IRON AND IRON BINDING CAPACITY (CC-WL,HP ONLY)
Iron: 60 ug/dL (ref 28–170)
Saturation Ratios: 21 % (ref 10.4–31.8)
TIBC: 286 ug/dL (ref 250–450)
UIBC: 226 ug/dL (ref 148–442)

## 2023-03-01 LAB — CMP (CANCER CENTER ONLY)
ALT: 21 U/L (ref 0–44)
AST: 18 U/L (ref 15–41)
Albumin: 4.2 g/dL (ref 3.5–5.0)
Alkaline Phosphatase: 74 U/L (ref 38–126)
Anion gap: 5 (ref 5–15)
BUN: 22 mg/dL — ABNORMAL HIGH (ref 6–20)
CO2: 29 mmol/L (ref 22–32)
Calcium: 9.1 mg/dL (ref 8.9–10.3)
Chloride: 102 mmol/L (ref 98–111)
Creatinine: 0.64 mg/dL (ref 0.44–1.00)
GFR, Estimated: >60 mL/min (ref >60)
Glucose, Bld: 93 mg/dL (ref 70–99)
Potassium: 4.1 mmol/L (ref 3.5–5.1)
Sodium: 136 mmol/L (ref 135–145)
Total Bilirubin: 0.4 mg/dL (ref 0.3–1.2)
Total Protein: 6.8 g/dL (ref 6.5–8.1)

## 2023-03-01 LAB — FERRITIN: Ferritin: 228 ng/mL (ref 11–307)

## 2023-03-01 LAB — RETICULOCYTES
Immature Retic Fract: 4.3 % (ref 2.3–15.9)
RBC.: 4.3 MIL/uL (ref 3.87–5.11)
Retic Count, Absolute: 61.9 10*3/uL (ref 19.0–186.0)
Retic Ct Pct: 1.4 % (ref 0.4–3.1)

## 2023-03-01 LAB — LACTATE DEHYDROGENASE: LDH: 125 U/L (ref 98–192)

## 2023-03-01 NOTE — Progress Notes (Signed)
Hematology and Oncology Follow Up Visit  Ann Calhoun [id number] 05/14/1966 57 y.o. 03/01/2023   Principle Diagnosis:  Iron deficiency anemia  Current Therapy:   IV iron-Feraheme given on 01/18/2023     Interim History:  Ann Calhoun is back for follow-up.  We last saw her back in May.  Since then, she been doing okay.  She still feels little bit tired.  She is having some night sweats.  She is worried about the night sweats.  She is worried about her liver.  I told her that we can do a ultrasound of her liver to see what might be going on.  I will know if she may have a little bit of a fatty liver.  She did get iron back in May.  At that time, her ferritin was 108 with an iron saturation of 18%.  Today, her iron saturation is 21%.  This was not all that much better.  She just does not do well with absorbing iron.  She did have a uterine biopsy.  She had a thickened endometrium.  Thankfully, there is no evidence of malignancy.  She has had no rashes.  She does have some "bad circulation".  I think she means that she has some venous return that is not as brisk.  She has had no weight loss.  She gained a little bit of weight.  She and her family will be going down to [place] for the July 4 weekend.  Currently, I would have to say that her performance status is probably ECOG 1.      Medications:  Current Outpatient Medications:    amoxicillin-clavulanate (AUGMENTIN) 875-125 MG tablet, Take 1 tablet by mouth 2 (two) times daily., Disp: 20 tablet, Rfl: 0   Ascorbic Acid (VITAMIN C WITH ROSE HIPS) 1000 MG tablet, Take 500 mg by mouth daily., Disp: , Rfl:    ascorbic acid (VITAMIN C) 500 MG tablet, Take 500 mg by mouth daily., Disp: , Rfl:    Cholecalciferol (VITAMIN D3 PO), Take by mouth., Disp: , Rfl:    CYTOMEL 5 MCG tablet, Take 2 tablets by mouth daily., Disp: , Rfl:    melatonin 3 MG TABS tablet, Take 3 mg by mouth at bedtime., Disp: , Rfl:    MINIVELLE 0.075 MG/24HR,  APPLY PATCH AS DIRECTED, Disp: , Rfl:    OVER THE COUNTER MEDICATION, Magnesium Glycinate 150mg   2 pills daily at bedtime, Disp: , Rfl:    OVER THE COUNTER MEDICATION, Magnesium citrate 270mg  Sig: 1-2 pills daily at dinner, Disp: , Rfl:    PROGESTERONE MICRONIZED PO, Take 100 mg by mouth daily. Compounded pill, Disp: , Rfl:    TIROSINT 50 MCG CAPS, Take 50 mcg by mouth daily., Disp: , Rfl:    VITAMIN D, CHOLECALCIFEROL, PO, Take 1 tablet by mouth daily. Takes 2500 IU daily., Disp: , Rfl:    ZINC GLUCONATE PO, Take 22 mg by mouth daily., Disp: , Rfl:   Allergies:  Allergies  Allergen Reactions   Nickel Rash and Other (See Comments)   Sulfa Antibiotics Hives, Itching and Other (See Comments)    Past Medical History, Surgical history, Social history, and Family History were reviewed and updated.  Review of Systems: Review of Systems  Constitutional: Negative.   HENT:  Negative.    Eyes: Negative.   Respiratory: Negative.    Cardiovascular: Negative.   Gastrointestinal: Negative.   Endocrine: Negative.   Genitourinary: Negative.    Musculoskeletal: Negative.   Skin: Negative.  Neurological: Negative.   Hematological: Negative.   Psychiatric/Behavioral: Negative.      Physical Exam: Her vital signs show temperature of 98.9.  Pulse 72.  Blood pressure 94/54.  Weight is 125 pounds.  Wt Readings from Last 3 Encounters:  01/12/23 121 lb 1.9 oz (54.9 kg)  09/13/22 116 lb 6.4 oz (52.8 kg)  08/06/22 116 lb 3.2 oz (52.7 kg)    Physical Exam Vitals reviewed.  HENT:     Head: Normocephalic and atraumatic.  Eyes:     Pupils: Pupils are equal, round, and reactive to light.  Cardiovascular:     Rate and Rhythm: Normal rate and regular rhythm.     Heart sounds: Normal heart sounds.  Pulmonary:     Effort: Pulmonary effort is normal.     Breath sounds: Normal breath sounds.  Abdominal:     General: Bowel sounds are normal.     Palpations: Abdomen is soft.  Musculoskeletal:         General: No tenderness or deformity. Normal range of motion.     Cervical back: Normal range of motion.  Lymphadenopathy:     Cervical: No cervical adenopathy.  Skin:    General: Skin is warm and dry.     Findings: No erythema or rash.  Neurological:     Mental Status: She is alert and oriented to person, place, and time.  Psychiatric:        Behavior: Behavior normal.        Thought Content: Thought content normal.        Judgment: Judgment normal.     Lab Results  Component Value Date   WBC 5.4 03/01/2023   HGB 12.8 03/01/2023   HCT 39.9 03/01/2023   MCV 92.6 03/01/2023   PLT 224 03/01/2023     Chemistry      Component Value Date/Time   NA 138 01/06/2023 0901   K 4.4 01/06/2023 0901   CL 102 01/06/2023 0901   CO2 28 01/06/2023 0901   BUN 25 (H) 01/06/2023 0901   CREATININE 0.69 01/06/2023 0901      Component Value Date/Time   CALCIUM 9.0 01/06/2023 0901   CALCIUM 9.2 01/06/2023 0901   ALKPHOS 67 01/06/2023 0901   AST 15 01/06/2023 0901   ALT 12 01/06/2023 0901   BILITOT 0.4 01/06/2023 0901       Impression and Plan: Ann Calhoun is a very nice 57 year old white female.  It is always a lot a fun talking to her.    I will have to see what her iron studies show.  Again, the iron saturation is not all that great.  I noted that she did well with the Monoferric.  I am not sure as to why her insurance would not agree to approve Monoferric.  We will see what the ultrasound of the abdomen shows.  I cannot imagine why should be have the night sweats.  Again I do not think there is any evidence of malignancy.  Nothing on her labs on her physical exam was really remarkable.  Again, we will have to sort out when to get her back to see [place].   Josph Macho, MD 7/2/20249:05 AM

## 2023-03-07 ENCOUNTER — Encounter: Payer: Self-pay | Admitting: Hematology & Oncology

## 2023-03-07 ENCOUNTER — Ambulatory Visit
Admission: RE | Admit: 2023-03-07 | Discharge: 2023-03-07 | Disposition: A | Discharge status: Home / Self Care | Payer: No Typology Code available for payment source | Source: Ambulatory Visit | Attending: Endocrinology | Admitting: Endocrinology

## 2023-03-07 DIAGNOSIS — E041 Nontoxic single thyroid nodule: Secondary | ICD-10-CM

## 2023-03-09 ENCOUNTER — Encounter: Payer: Self-pay | Admitting: Hematology & Oncology

## 2023-03-09 NOTE — Addendum Note (Signed)
Addended by: Josph Macho on: 03/09/2023 04:35 PM   Modules accepted: Orders

## 2023-03-14 ENCOUNTER — Telehealth: Payer: Self-pay | Admitting: *Deleted

## 2023-03-14 ENCOUNTER — Other Ambulatory Visit: Payer: Self-pay | Admitting: Family

## 2023-03-14 ENCOUNTER — Ambulatory Visit (HOSPITAL_COMMUNITY)
Admission: RE | Admit: 2023-03-14 | Discharge: 2023-03-14 | Disposition: A | Discharge status: Home / Self Care | Payer: No Typology Code available for payment source | Source: Ambulatory Visit | Attending: Hematology & Oncology | Admitting: Hematology & Oncology

## 2023-03-14 DIAGNOSIS — E7800 Pure hypercholesterolemia, unspecified: Secondary | ICD-10-CM | POA: Diagnosis present

## 2023-03-14 DIAGNOSIS — K7689 Other specified diseases of liver: Secondary | ICD-10-CM | POA: Insufficient documentation

## 2023-03-14 DIAGNOSIS — N6320 Unspecified lump in the left breast, unspecified quadrant: Secondary | ICD-10-CM

## 2023-03-14 NOTE — Telephone Encounter (Signed)
-----   Message from Josph Macho sent at 03/14/2023  1:38 PM EDT ----- Call - the gall bladder looks okay.  You may have a fatty liver.  This is something that your PCP can manage.  Cindee Lame

## 2023-03-15 ENCOUNTER — Other Ambulatory Visit: Payer: Self-pay | Admitting: Medical Oncology

## 2023-03-18 ENCOUNTER — Ambulatory Visit
Admission: RE | Admit: 2023-03-18 | Discharge: 2023-03-18 | Disposition: A | Discharge status: Home / Self Care | Payer: No Typology Code available for payment source | Source: Ambulatory Visit | Attending: Family | Admitting: Family

## 2023-03-18 ENCOUNTER — Encounter (HOSPITAL_BASED_OUTPATIENT_CLINIC_OR_DEPARTMENT_OTHER): Payer: No Typology Code available for payment source | Admitting: Internal Medicine

## 2023-03-18 ENCOUNTER — Other Ambulatory Visit: Payer: Self-pay | Admitting: Family

## 2023-03-18 DIAGNOSIS — N6320 Unspecified lump in the left breast, unspecified quadrant: Secondary | ICD-10-CM

## 2023-03-21 ENCOUNTER — Other Ambulatory Visit: Payer: No Typology Code available for payment source

## 2023-03-22 ENCOUNTER — Other Ambulatory Visit: Payer: Self-pay | Admitting: Hematology & Oncology

## 2023-03-22 ENCOUNTER — Ambulatory Visit
Admission: RE | Admit: 2023-03-22 | Discharge: 2023-03-22 | Disposition: A | Discharge status: Home / Self Care | Payer: No Typology Code available for payment source | Source: Ambulatory Visit | Attending: Family | Admitting: Family

## 2023-03-22 ENCOUNTER — Inpatient Hospital Stay: Payer: PRIVATE HEALTH INSURANCE

## 2023-03-22 VITALS — BP 88/53 | HR 70 | Temp 98.4°F | Resp 17

## 2023-03-22 DIAGNOSIS — D500 Iron deficiency anemia secondary to blood loss (chronic): Secondary | ICD-10-CM

## 2023-03-22 DIAGNOSIS — N6320 Unspecified lump in the left breast, unspecified quadrant: Secondary | ICD-10-CM

## 2023-03-22 DIAGNOSIS — D509 Iron deficiency anemia, unspecified: Secondary | ICD-10-CM | POA: Diagnosis not present

## 2023-03-22 DIAGNOSIS — K909 Intestinal malabsorption, unspecified: Secondary | ICD-10-CM

## 2023-03-22 HISTORY — PX: BREAST BIOPSY: SHX20

## 2023-03-22 MED ORDER — SODIUM CHLORIDE 0.9 % IV SOLN: Freq: Once | INTRAVENOUS | Status: AC

## 2023-03-22 MED ORDER — LORATADINE 10 MG PO TABS
10.0000 mg | ORAL_TABLET | Freq: Once | ORAL | Status: AC
  Administered 2023-03-22: 10 mg via ORAL
  Filled 2023-03-22: qty 1

## 2023-03-22 MED ORDER — ACETAMINOPHEN 325 MG PO TABS
650.0000 mg | ORAL_TABLET | Freq: Once | ORAL | Status: AC
  Administered 2023-03-22: 650 mg via ORAL
  Filled 2023-03-22: qty 2

## 2023-03-22 MED ORDER — SODIUM CHLORIDE 0.9 % IV SOLN
1000.0000 mg | Freq: Once | INTRAVENOUS | Status: AC
  Administered 2023-03-22: 1000 mg via INTRAVENOUS
  Filled 2023-03-22: qty 10

## 2023-03-22 NOTE — Patient Instructions (Signed)

## 2023-03-23 ENCOUNTER — Other Ambulatory Visit: Payer: Self-pay | Admitting: Hematology & Oncology

## 2023-03-23 ENCOUNTER — Other Ambulatory Visit: Payer: Self-pay | Admitting: Surgery

## 2023-03-24 ENCOUNTER — Ambulatory Visit: Payer: Self-pay | Admitting: Surgery

## 2023-03-28 ENCOUNTER — Ambulatory Visit (HOSPITAL_BASED_OUTPATIENT_CLINIC_OR_DEPARTMENT_OTHER)
Payer: No Typology Code available for payment source | Attending: Obstetrics and Gynecology | Admitting: Internal Medicine

## 2023-03-28 VITALS — Ht 65.0 in | Wt 124.0 lb

## 2023-03-28 DIAGNOSIS — G478 Other sleep disorders: Secondary | ICD-10-CM

## 2023-03-28 DIAGNOSIS — R0683 Snoring: Secondary | ICD-10-CM | POA: Insufficient documentation

## 2023-03-28 DIAGNOSIS — G4730 Sleep apnea, unspecified: Secondary | ICD-10-CM

## 2023-03-30 ENCOUNTER — Other Ambulatory Visit: Payer: Self-pay | Admitting: Surgery

## 2023-03-31 ENCOUNTER — Other Ambulatory Visit: Payer: Self-pay | Admitting: Surgery

## 2023-03-31 DIAGNOSIS — N6342 Unspecified lump in left breast, subareolar: Secondary | ICD-10-CM

## 2023-04-02 DIAGNOSIS — G4730 Sleep apnea, unspecified: Secondary | ICD-10-CM

## 2023-04-02 NOTE — Procedures (Signed)
    Patient Name: Ann Calhoun, Ann Calhoun Date: 03/28/2023 Gender: Female D.O.B: 1966/02/25 Age (years): 57 Referring Provider: Marcelle Overlie Height (inches): 65 Interpreting Physician: Jetty Duhamel MD, ABSM Weight (lbs): 124 RPSGT: [place] BMI: 21 MRN: [id number] Neck Size: <br>  CLINICAL INFORMATION Sleep Study Type: HST Indication for sleep study: snoring Epworth Sleepiness Score: 2  SLEEP STUDY TECHNIQUE A multi-channel overnight portable sleep study was performed. The channels recorded were: nasal airflow, thoracic respiratory movement, and oxygen saturation with a pulse oximetry. Snoring was also monitored.  MEDICATIONS Patient self administered medications include: none reported.  SLEEP ARCHITECTURE Patient was studied for 376 minutes. The sleep efficiency was 100.0 % and the patient was supine for 0%. The arousal index was 0.0 per hour.  RESPIRATORY PARAMETERS The overall AHI was 4.3 per hour, with a central apnea index of 0 per hour. The oxygen nadir was 91% during sleep.  CARDIAC DATA Mean heart rate during sleep was 72.2 bpm.  IMPRESSIONS - No significant obstructive sleep apnea occurred during this study, within normal limits (AHI = 4.3/h). - The patient had minimal or no oxygen desaturation during the study (Min O2 = 91%) - No snoring was audible during this study.  DIAGNOSIS - Normal study  RECOMMENDATIONS - If results are iunconsistent with clinical impression, consider return for in-center overnight sleep study. - Be careful with alcohol, sedatives and other CNS depressants that may worsen sleep apnea and disrupt normal sleep architecture. - Sleep hygiene should be reviewed to assess factors that may improve sleep quality. - Weight management and regular exercise should be initiated or continued.  [Electronically signed] 04/02/2023 12:56 PM  Jetty Duhamel MD, ABSM Diplomate, American Board of Sleep Medicine NPI: [id number]                          Jetty Duhamel Diplomate, American Board of Sleep Medicine  ELECTRONICALLY SIGNED ON:  04/02/2023, 12:53 PM [facility] PH: [phone]   FX: [phone] ACCREDITED BY THE AMERICAN ACADEMY OF SLEEP MEDICINE

## 2023-05-07 ENCOUNTER — Other Ambulatory Visit: Payer: No Typology Code available for payment source

## 2023-05-09 ENCOUNTER — Inpatient Hospital Stay: Payer: PRIVATE HEALTH INSURANCE | Attending: Hematology & Oncology

## 2023-05-09 ENCOUNTER — Ambulatory Visit: Payer: No Typology Code available for payment source | Admitting: Hematology & Oncology

## 2023-05-09 DIAGNOSIS — Z79899 Other long term (current) drug therapy: Secondary | ICD-10-CM | POA: Diagnosis not present

## 2023-05-09 DIAGNOSIS — I872 Venous insufficiency (chronic) (peripheral): Secondary | ICD-10-CM | POA: Insufficient documentation

## 2023-05-09 DIAGNOSIS — D500 Iron deficiency anemia secondary to blood loss (chronic): Secondary | ICD-10-CM

## 2023-05-09 DIAGNOSIS — D509 Iron deficiency anemia, unspecified: Secondary | ICD-10-CM | POA: Insufficient documentation

## 2023-05-09 DIAGNOSIS — E7800 Pure hypercholesterolemia, unspecified: Secondary | ICD-10-CM

## 2023-05-09 LAB — IRON AND IRON BINDING CAPACITY (CC-WL,HP ONLY)
Iron: 113 ug/dL (ref 28–170)
Saturation Ratios: 40 % — ABNORMAL HIGH (ref 10.4–31.8)
TIBC: 284 ug/dL (ref 250–450)
UIBC: 171 ug/dL (ref 148–442)

## 2023-05-09 LAB — CMP (CANCER CENTER ONLY)
ALT: 24 U/L (ref 0–44)
AST: 17 U/L (ref 15–41)
Albumin: 4.3 g/dL (ref 3.5–5.0)
Alkaline Phosphatase: 72 U/L (ref 38–126)
Anion gap: 5 (ref 5–15)
BUN: 21 mg/dL — ABNORMAL HIGH (ref 6–20)
CO2: 30 mmol/L (ref 22–32)
Calcium: 9 mg/dL (ref 8.9–10.3)
Chloride: 103 mmol/L (ref 98–111)
Creatinine: 0.63 mg/dL (ref 0.44–1.00)
GFR, Estimated: >60 mL/min (ref >60)
Glucose, Bld: 103 mg/dL — ABNORMAL HIGH (ref 70–99)
Potassium: 4.5 mmol/L (ref 3.5–5.1)
Sodium: 138 mmol/L (ref 135–145)
Total Bilirubin: 0.5 mg/dL (ref 0.3–1.2)
Total Protein: 6.6 g/dL (ref 6.5–8.1)

## 2023-05-09 LAB — CBC WITH DIFFERENTIAL (CANCER CENTER ONLY)
Abs Immature Granulocytes: 0.01 10*3/uL (ref 0.00–0.07)
Basophils Absolute: 0.1 10*3/uL (ref 0.0–0.1)
Basophils Relative: 1 %
Eosinophils Absolute: 0.1 10*3/uL (ref 0.0–0.5)
Eosinophils Relative: 3 %
HCT: 42.9 % (ref 36.0–46.0)
Hemoglobin: 13.8 g/dL (ref 12.0–15.0)
Immature Granulocytes: 0 %
Lymphocytes Relative: 34 %
Lymphs Abs: 1.4 10*3/uL (ref 0.7–4.0)
MCH: 29.8 pg (ref 26.0–34.0)
MCHC: 32.2 g/dL (ref 30.0–36.0)
MCV: 92.7 fL (ref 80.0–100.0)
Monocytes Absolute: 0.5 10*3/uL (ref 0.1–1.0)
Monocytes Relative: 13 %
Neutro Abs: 2 10*3/uL (ref 1.7–7.7)
Neutrophils Relative %: 49 %
Platelet Count: 218 10*3/uL (ref 150–400)
RBC: 4.63 MIL/uL (ref 3.87–5.11)
RDW: 11.9 % (ref 11.5–15.5)
WBC Count: 4.2 10*3/uL (ref 4.0–10.5)
nRBC: 0 % (ref 0.0–0.2)

## 2023-05-09 LAB — FERRITIN: Ferritin: 440 ng/mL — ABNORMAL HIGH (ref 11–307)

## 2023-05-09 LAB — TSH: TSH: 2.089 u[IU]/mL (ref 0.350–4.500)

## 2023-05-09 LAB — RETICULOCYTES
Immature Retic Fract: 7.6 % (ref 2.3–15.9)
RBC.: 4.58 MIL/uL (ref 3.87–5.11)
Retic Count, Absolute: 83.4 10*3/uL (ref 19.0–186.0)
Retic Ct Pct: 1.8 % (ref 0.4–3.1)

## 2023-05-16 ENCOUNTER — Inpatient Hospital Stay (HOSPITAL_BASED_OUTPATIENT_CLINIC_OR_DEPARTMENT_OTHER): Payer: PRIVATE HEALTH INSURANCE | Admitting: Family

## 2023-05-16 ENCOUNTER — Encounter: Payer: Self-pay | Admitting: Family

## 2023-05-16 ENCOUNTER — Other Ambulatory Visit: Payer: Self-pay

## 2023-05-16 VITALS — BP 95/43 | HR 73 | Temp 99.7°F | Resp 19 | Ht 65.5 in | Wt 125.0 lb

## 2023-05-16 DIAGNOSIS — D500 Iron deficiency anemia secondary to blood loss (chronic): Secondary | ICD-10-CM | POA: Diagnosis not present

## 2023-05-16 DIAGNOSIS — R202 Paresthesia of skin: Secondary | ICD-10-CM | POA: Diagnosis not present

## 2023-05-16 DIAGNOSIS — D501 Sideropenic dysphagia: Secondary | ICD-10-CM

## 2023-05-16 DIAGNOSIS — K909 Intestinal malabsorption, unspecified: Secondary | ICD-10-CM | POA: Diagnosis not present

## 2023-05-16 DIAGNOSIS — D509 Iron deficiency anemia, unspecified: Secondary | ICD-10-CM | POA: Diagnosis not present

## 2023-05-16 NOTE — Progress Notes (Signed)
Hematology and Oncology Follow Up Visit  Ann MCKINZEY [id number] 1965-10-15 57 Calhoun.o. 05/16/2023   Principle Diagnosis:  Iron deficiency anemia   Current Therapy:        IV iron as indicated    Interim History:  Ann Calhoun is here today for follow-up. She is doing well but recently lost her precious dog. This has been hard on her.  She did respond well to the Monoferric she received in July.  Iron studies look great.  She has not noted any blood loss. No bruising or petechiae.  She has had occasional episodes of palpitations recently. She has adjusted her dose of Tirosint and this seems to have helped. She is working with her PCP to find the right regimen.  No fever, chills, n/v, cough, rash, dizziness, SOB, chest pain, abdominal pain or changes in bowel or bladder habits.  She has intermittent mild swelling in her lower extremities/ankles due to venous insufficiency. She wears compression stockings for added support which do help.  No falls or syncope reported.  Appetite and hydration are good. Weight is stable at 125 lbs.   ECOG Performance Status: 0 - Asymptomatic  Medications:  Allergies as of 05/16/2023       Reactions   Nickel Rash, Other (See Comments)   Sulfa Antibiotics Hives, Itching, Other (See Comments)        Medication List        Accurate as of May 16, 2023  3:47 PM. If you have any questions, ask your nurse or doctor.          STOP taking these medications    amoxicillin-clavulanate 875-125 MG tablet Commonly known as: AUGMENTIN Stopped by: Eileen Stanford       TAKE these medications    Cytomel 5 MCG tablet Generic drug: liothyronine Take 2 tablets by mouth daily.   melatonin 3 MG Tabs tablet Take 3 mg by mouth at bedtime.   Minivelle 0.075 MG/24HR Generic drug: estradiol APPLY PATCH AS DIRECTED   OVER THE COUNTER MEDICATION Magnesium Glycinate 150mg   2 pills daily at bedtime   OVER THE COUNTER MEDICATION Magnesium  citrate 270mg  Sig: 1-2 pills daily at dinner   PROGESTERONE MICRONIZED PO Take 100 mg by mouth daily. Compounded pill   Tirosint 50 MCG Caps Generic drug: Levothyroxine Sodium Take 50 mcg by mouth daily.   vitamin C with rose hips 1000 MG tablet Take 500 mg by mouth daily.   ascorbic acid 500 MG tablet Commonly known as: VITAMIN C Take 500 mg by mouth daily.   VITAMIN D3 PO Take by mouth.   VITAMIN D (CHOLECALCIFEROL) PO Take 1 tablet by mouth daily. Takes 2500 IU daily.   ZINC GLUCONATE PO Take 22 mg by mouth daily.        Allergies:  Allergies  Allergen Reactions   Nickel Rash and Other (See Comments)   Sulfa Antibiotics Hives, Itching and Other (See Comments)    Past Medical History, Surgical history, Social history, and Family History were reviewed and updated.  Review of Systems: All other 10 point review of systems is negative.   Physical Exam:  vitals were not taken for this visit.   Wt Readings from Last 3 Encounters:  03/28/23 124 lb (56.2 kg)  03/01/23 125 lb (56.7 kg)  01/12/23 121 lb 1.9 oz (54.9 kg)    Ocular: Sclerae unicteric, pupils equal, round and reactive to light Ear-nose-throat: Oropharynx clear, dentition fair Lymphatic: No cervical or supraclavicular adenopathy Lungs no rales  or rhonchi, good excursion bilaterally Heart regular rate and rhythm, no murmur appreciated Abd soft, nontender, positive bowel sounds MSK no focal spinal tenderness, no joint edema Neuro: non-focal, well-oriented, appropriate affect Breasts: Deferred   Lab Results  Component Value Date   WBC 4.2 05/09/2023   HGB 13.8 05/09/2023   HCT 42.9 05/09/2023   MCV 92.7 05/09/2023   PLT 218 05/09/2023   Lab Results  Component Value Date   FERRITIN 440 (H) 05/09/2023   IRON 113 05/09/2023   TIBC 284 05/09/2023   UIBC 171 05/09/2023   IRONPCTSAT 40 (H) 05/09/2023   Lab Results  Component Value Date   RETICCTPCT 1.8 05/09/2023   RBC 4.63 05/09/2023   RBC  4.58 05/09/2023   No results found for: "KPAFRELGTCHN", "LAMBDASER", "KAPLAMBRATIO" Lab Results  Component Value Date   IGGSERUM 803 02/12/2022   IGA 253 02/12/2022   IGMSERUM 129 02/12/2022   No results found for: "TOTALPROTELP", "ALBUMINELP", "A1GS", "A2GS", "BETS", "BETA2SER", "GAMS", "MSPIKE", "SPEI"   Chemistry      Component Value Date/Time   NA 138 05/09/2023 0942   K 4.5 05/09/2023 0942   CL 103 05/09/2023 0942   CO2 30 05/09/2023 0942   BUN 21 (H) 05/09/2023 0942   CREATININE 0.63 05/09/2023 0942      Component Value Date/Time   CALCIUM 9.0 05/09/2023 0942   CALCIUM 9.0 01/06/2023 0901   ALKPHOS 72 05/09/2023 0942   AST 17 05/09/2023 0942   ALT 24 05/09/2023 0942   BILITOT 0.5 05/09/2023 0942       Impression and Plan: Ann Calhoun is a very nice 57 yo caucasian female with history of IDA.  Iron studies are stable at this time. No intervention needed.  Follow-up in 4 months.   Eileen Stanford, NP 9/16/20243:47 PM

## 2023-05-25 DIAGNOSIS — M7989 Other specified soft tissue disorders: Secondary | ICD-10-CM

## 2023-05-27 ENCOUNTER — Other Ambulatory Visit: Payer: Self-pay | Admitting: *Deleted

## 2023-05-27 DIAGNOSIS — M7989 Other specified soft tissue disorders: Secondary | ICD-10-CM

## 2023-05-28 ENCOUNTER — Ambulatory Visit
Admission: RE | Admit: 2023-05-28 | Discharge: 2023-05-28 | Disposition: A | Discharge status: Home / Self Care | Payer: No Typology Code available for payment source | Source: Ambulatory Visit | Attending: Surgery | Admitting: Surgery

## 2023-05-28 DIAGNOSIS — N6342 Unspecified lump in left breast, subareolar: Secondary | ICD-10-CM

## 2023-05-28 MED ORDER — GADOPICLENOL 0.5 MMOL/ML IV SOLN
7.5000 mL | Freq: Once | INTRAVENOUS | Status: AC | PRN
  Administered 2023-05-28: 6 mL via INTRAVENOUS

## 2023-05-31 NOTE — Progress Notes (Signed)
I let the patient know about her normal MRI.  6 month follow-up diagnostic mammogram/ [place]

## 2023-06-06 ENCOUNTER — Ambulatory Visit (HOSPITAL_COMMUNITY)
Admission: RE | Admit: 2023-06-06 | Discharge: 2023-06-06 | Disposition: A | Discharge status: Home / Self Care | Payer: PRIVATE HEALTH INSURANCE | Source: Ambulatory Visit | Attending: Surgery | Admitting: Surgery

## 2023-06-06 ENCOUNTER — Telehealth: Payer: Self-pay

## 2023-06-06 ENCOUNTER — Ambulatory Visit (INDEPENDENT_AMBULATORY_CARE_PROVIDER_SITE_OTHER): Payer: No Typology Code available for payment source | Admitting: Physician Assistant

## 2023-06-06 VITALS — BP 104/45 | HR 81 | Temp 97.4°F | Resp 18 | Ht 65.0 in | Wt 126.2 lb

## 2023-06-06 DIAGNOSIS — I890 Lymphedema, not elsewhere classified: Secondary | ICD-10-CM | POA: Diagnosis not present

## 2023-06-06 DIAGNOSIS — M7989 Other specified soft tissue disorders: Secondary | ICD-10-CM | POA: Insufficient documentation

## 2023-06-06 DIAGNOSIS — I872 Venous insufficiency (chronic) (peripheral): Secondary | ICD-10-CM

## 2023-06-06 NOTE — Progress Notes (Signed)
[facility] [place]   Reason for referral: Swollen  legs  History of Present Illness  Ann Calhoun is a 57 y.o. female who presents with chief complaint: swollen legs/ankle and feet.  Patient notes, onset of swelling > 3 years  ago, associated with walking more than an hour.  The patient has had no history of DVT, no history of varicose vein, no history of venous stasis ulcers, positive history of  Lymphedema symptoms with edema in the ankles and feet and no permanent  skin changes in lower legs.  There is no family history of venous disorders.  The patient has  used compression stockings in the past.  She states when she walked at the beach for more than an hour her right > left leg and ankle into the foot became swollen and red.  The ankle was very itchy and she states she scratched it almost raw.  She states if she wears her knee high compression the edema stays away with mild tightness in the right ankle.  She denies claudication, rest pain and non healing wounds.  She does have spider veins posterior right knee.       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   Kidney stones    Nephrolithiasis    in her 20's   Osteopenia    Postpartum depression    Vitamin D deficiency     Past Surgical History:  Procedure Laterality Date   6 HOUR PH STUDY N/A 06/25/2020   Procedure: 24 HOUR PH STUDY;  Surgeon: Iva Boop, MD;  Location: [facility];  Service: Endoscopy;  Laterality: N/A;   BREAST BIOPSY Left 2015   benign   BREAST BIOPSY Left 02/01/2019   BREAST BIOPSY Left 03/22/2023   [place] LT BREAST BX W LOC DEV 1ST LESION IMG BX SPEC US GUIDE 03/22/2023 [facility]   CESAREAN  SECTION     uterus repair for perforation   COLONOSCOPY  multiple   ESOPHAGEAL MANOMETRY N/A 06/25/2020   Procedure: ESOPHAGEAL MANOMETRY (EM);  Surgeon: Iva Boop, MD;  Location: [facility];  Service: Endoscopy;  Laterality: N/A;   KIDNEY STONE SURGERY  1991   got septic afterwards.   UPPER GASTROINTESTINAL ENDOSCOPY  05/12/2021   Bravo capsule placement    Social History   Socioeconomic History   Marital status: Married    Spouse name: Not on file   Number of children: Not on file   Years of education: Not on file   Highest education level: Not on file  Occupational History   Not on file  Tobacco Use   Smoking status: Former    Current packs/day: 0.00    Types: Cigarettes    Quit date: 12/30/1995    Years since quitting: 27.4   Smokeless tobacco: Never  Vaping Use   Vaping status: Never Used  Substance and Sexual Activity   Alcohol use: Yes    Alcohol/week: 4.0 standard drinks of alcohol    Types: 4 Glasses of wine per week    Comment: not  currently   Drug use: No   Sexual activity: Not on file  Other Topics Concern  Not on file  Social History Narrative   Married to Dr. Alfredo Martinez   One daughter   Social Determinants of Health   Financial Resource Strain: Not on file  Food Insecurity: Not on file  Transportation Needs: Not on file  Physical Activity: Not on file  Stress: Not on file  Social Connections: Unknown (04/08/2022)   [facility], [facility]   Social Network    Social Network: Not on file  Intimate Partner Violence: Unknown (04/08/2022)   Received from [facility], [facility]   HITS    Physically Hurt: Not on file    Insult or Talk Down To: Not on file    Threaten Physical Harm: Not on file    Scream or Curse: Not on file    Family History  Problem Relation Age of Onset   Colon polyps Mother    Atrial fibrillation Mother    Hypothyroidism Mother    Colon cancer Maternal Grandfather 34   Breast cancer  Maternal Grandmother    Esophageal cancer Maternal Grandmother    Glaucoma Maternal Grandmother    Diabetes Father    Asthma Brother    CVA Paternal Grandmother    Rectal cancer Neg Hx    Stomach cancer Neg Hx     Current Outpatient Medications on File Prior to Visit  Medication Sig Dispense Refill   estradiol (VIVELLE-DOT) 0.075 MG/24HR Place 1 patch onto the skin 2 (two) times a week.     Ascorbic Acid (VITAMIN C WITH ROSE HIPS) 1000 MG tablet Take 500 mg by mouth daily.     ascorbic acid (VITAMIN C) 500 MG tablet Take 500 mg by mouth daily.     Cholecalciferol (VITAMIN D3 PO) Take by mouth.     CYTOMEL 5 MCG tablet Take 2 tablets by mouth daily.     melatonin 3 MG TABS tablet Take 3 mg by mouth at bedtime.     MINIVELLE 0.075 MG/24HR APPLY PATCH AS DIRECTED     OVER THE COUNTER MEDICATION Magnesium Glycinate 150mg   2 pills daily at bedtime     OVER THE COUNTER MEDICATION Magnesium citrate 270mg  Sig: 1-2 pills daily at dinner     PROGESTERONE MICRONIZED PO Take 100 mg by mouth daily. Compounded pill     TIROSINT 50 MCG CAPS Take 50 mcg by mouth daily.     VITAMIN D, CHOLECALCIFEROL, PO Take 1 tablet by mouth daily. Takes 2500 IU daily.     ZINC GLUCONATE PO Take 22 mg by mouth daily.     No current facility-administered medications on file prior to visit.    Allergies as of 06/06/2023 - Review Complete 06/06/2023  Allergen Reaction Noted   Nickel Rash and Other (See Comments) 05/12/2021   Sulfa antibiotics Hives, Itching, and Other (See Comments) 10/09/2012   Vueway [gadopiclenol] Hives 05/28/2023     ROS:   General:  No weight loss, Fever, chills  HEENT: No recent headaches, no nasal bleeding, no visual changes, no sore throat  Neurologic: No dizziness, blackouts, seizures. No recent symptoms of stroke or mini- stroke. No recent episodes of slurred speech, or temporary blindness.  Cardiac: No recent episodes of chest pain/pressure, no shortness of breath at rest.  No  shortness of breath with exertion.  Denies history of atrial fibrillation or irregular heartbeat  Vascular: No history of rest pain in feet.  No history of claudication.  No history of non-healing ulcer, No history of DVT   Pulmonary: No  home oxygen, no productive cough, no hemoptysis,  No asthma or wheezing  Musculoskeletal:  [ ]  Arthritis, [ ]  Low back pain,  [ ]  Joint pain  Hematologic:No history of hypercoagulable state.  No history of easy bleeding.  positive history of anemia  Gastrointestinal: No hematochezia or melena,  No gastroesophageal reflux, no trouble swallowing  Urinary: [ ]  chronic Kidney disease, [ ]  on HD - [ ]  MWF or [ ]  TTHS, [ ]  Burning with urination, [ ]  Frequent urination, [ ]  Difficulty urinating;   Skin: No rashes  Psychological: No history of anxiety,  No history of depression  Physical Examination  Vitals:   06/06/23 1457  BP: (!) 104/45  Pulse: 81  Resp: 18  Temp: (!) 97.4 F (36.3 C)  TempSrc: Temporal  SpO2: 95%  Weight: 126 lb 3.2 oz (57.2 kg)  Height: 5\' 5"  (1.651 m)    Body mass index is 21 kg/m.  General:  Alert and oriented, no acute distress HEENT: Normal Neck: No bruit or JVD Pulmonary: Clear to auscultation bilaterally Cardiac: Regular Rate and Rhythm without murmur Abdomen: Soft, non-tender, non-distended, no mass, no scars Skin: No rash       Extremity Pulses:  2+ radial, brachial, femoral, dorsalis pedis, posterior tibial pulses bilaterally Musculoskeletal: No deformity or very minimal BK edema  Neurologic: Upper and lower extremity motor 5/5 and symmetric  DATA: +--------------+---------+------+-----------+------------+--------+  right        Reflux NoRefluxReflux TimeDiameter cmsComments                          Yes                                   +--------------+---------+------+-----------+------------+--------+  CFV          no                                               +--------------+---------+------+-----------+------------+--------+  FV mid        no                                              +--------------+---------+------+-----------+------------+--------+  Popliteal    no                                              +--------------+---------+------+-----------+------------+--------+  GSV at SFJ    no                            .603              +--------------+---------+------+-----------+------------+--------+  GSV prox thigh          yes    >500 ms      .371              +--------------+---------+------+-----------+------------+--------+  GSV mid thigh no                            .  450              +--------------+---------+------+-----------+------------+--------+  GSV dist thighno                            .551              +--------------+---------+------+-----------+------------+--------+  GSV at knee   no                            .277              +--------------+---------+------+-----------+------------+--------+  GSV prox calf no                            .246              +--------------+---------+------+-----------+------------+--------+  SSV Pop Fossa no                            .217              +--------------+---------+------+-----------+------------+--------+  SSV prox calf no                            .123              +--------------+---------+------+-----------+------------+--------+         Summary:  Right:  - No evidence of deep vein thrombosis seen in the right lower extremity,  from the common femoral through the popliteal veins.  - No evidence of superficial venous thrombosis in the right lower  extremity.  - Venous reflux is noted in the right greater saphenous vein in the thigh.   Assessment/Plan: Right LE Venous reflux GSV without SFJ and no enlarged veins on the right LE She describes lymphedema with the swelling in the ankle and  feet.  She has pictures on her phone of this from when she walked an hour that showed significant edema into the foot and ankle.  She is not a candidate for laser ablation as the vein diameter does not meet qualifications.      I recommend continue mild knee high compression, elevation when at rest and exercise.  She is not at risk of limb loss she has palpable pedal pulses B and no skin changes.  She states she saw Dr. Myra Gianotti in the past and would like to visit with him just to put her mind at ease.      Mosetta Pigeon PA-C [facility] [place] Office: [phone]  MD in clinic [place]

## 2023-06-06 NOTE — Telephone Encounter (Signed)
Pt walked into clinic, filled out triage form, form given to triage RN.  Pt was taken to exam room, measured for compression stockings, pt purchased.  Pt stated that she used to have symptoms only at HS, but is now having to wear compressions all day. She c/o increased BLE swelling and unprovoked intermittent red splotches on her legs. Appts scheduled for LE Reflux and PA. Confirmed understanding.

## 2023-08-01 ENCOUNTER — Encounter: Payer: Self-pay | Admitting: Obstetrics and Gynecology

## 2023-08-01 ENCOUNTER — Other Ambulatory Visit: Payer: Self-pay | Admitting: Obstetrics and Gynecology

## 2023-08-01 DIAGNOSIS — N63 Unspecified lump in unspecified breast: Secondary | ICD-10-CM

## 2023-09-05 ENCOUNTER — Encounter: Payer: Self-pay | Admitting: Surgery

## 2023-09-05 ENCOUNTER — Ambulatory Visit (INDEPENDENT_AMBULATORY_CARE_PROVIDER_SITE_OTHER): Payer: No Typology Code available for payment source | Admitting: Surgery

## 2023-09-05 VITALS — BP 93/59 | HR 82 | Temp 98.1°F | Resp 20 | Ht 65.0 in | Wt 128.6 lb

## 2023-09-05 DIAGNOSIS — I872 Venous insufficiency (chronic) (peripheral): Secondary | ICD-10-CM

## 2023-09-05 NOTE — Progress Notes (Signed)
 Vascular and Vein Specialist of [facility]  Patient name: Ann Calhoun MRN: [id number] DOB: 1966-05-29 Sex: female   REASON FOR VISIT:    Follow-up varicose vein  HISOTRY OF PRESENT ILLNESS:    Ann Calhoun is a 58 y.o. female who was seen in our office 3 months ago for complaints of swelling.  Her symptoms have been going on for at least 3 years.  They are aggravated by prolonged standing and walking.  She denies any history of prior DVT.  There is no significant family history for venous disease.  Her right leg bothers her more than the left.  It is mainly swelling and heaviness and occasional itching.  When she was last seen, she was placed in 20-30 thigh-high compression stockings.  She can walk for about an hour but after that she breaks out and some form of a rash.  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   Kidney stones    Nephrolithiasis    in her 20's   Osteopenia    Postpartum depression    Vitamin D  deficiency      FAMILY HISTORY:   Family History  Problem Relation Age of Onset   Colon polyps Mother    Atrial fibrillation Mother    Hypothyroidism Mother    Colon cancer Maternal Grandfather 55   Breast cancer Maternal Grandmother    Esophageal cancer Maternal Grandmother    Glaucoma Maternal Grandmother    Diabetes Father    Asthma Brother    CVA Paternal Grandmother    Rectal cancer Neg Hx    Stomach cancer Neg Hx     SOCIAL HISTORY:   Social History   Tobacco Use   Smoking status: Former    Current packs/day: 0.00    Types:  Cigarettes    Quit date: 12/30/1995    Years since quitting: 27.7   Smokeless tobacco: Never  Substance Use Topics   Alcohol use: Yes    Alcohol/week: 4.0 standard drinks of alcohol    Types: 4 Glasses of wine per week    Comment: not  currently     ALLERGIES:   Allergies  Allergen Reactions   Nickel Rash and Other (See Comments)   Sulfa Antibiotics Hives, Itching and Other (See Comments)   Vueway  [Gadopiclenol ] Hives    Patient noted to have hives on back, inner thigh and head approximately 5 minutes after injection of Vueway   Previously was given Multihance  and tolerated it well      CURRENT MEDICATIONS:   Current Outpatient Medications  Medication Sig Dispense Refill   Ascorbic Acid (VITAMIN C WITH ROSE HIPS) 1000 MG tablet Take 500 mg by mouth daily.     ascorbic acid (VITAMIN C) 500 MG tablet Take 500 mg by mouth daily.     Cholecalciferol (VITAMIN D3 PO) Take by mouth.     CYTOMEL 5 MCG tablet Take 2 tablets by mouth daily.     estradiol (VIVELLE-DOT) 0.075 MG/24HR Place 1 patch onto the skin 2 (two) times a week.     melatonin 3  MG TABS tablet Take 3 mg by mouth at bedtime.     MINIVELLE 0.075 MG/24HR APPLY PATCH AS DIRECTED     OVER THE COUNTER MEDICATION Magnesium Glycinate 150mg   2 pills daily at bedtime     OVER THE COUNTER MEDICATION Magnesium citrate 270mg  Sig: 1-2 pills daily at dinner     PROGESTERONE MICRONIZED PO Take 100 mg by mouth daily. Compounded pill     TIROSINT 50 MCG CAPS Take 50 mcg by mouth daily.     VITAMIN D , CHOLECALCIFEROL, PO Take 1 tablet by mouth daily. Takes 2500 IU daily.     ZINC GLUCONATE PO Take 22 mg by mouth daily.     No current facility-administered medications for this visit.    REVIEW OF SYSTEMS:   [X]  denotes positive finding, [ ]  denotes negative finding Cardiac  Comments:  Chest pain or chest pressure:    Shortness of breath upon exertion:    Short of breath when lying flat:    Irregular heart rhythm:         Vascular    Pain in calf, thigh, or hip brought on by ambulation:    Pain in feet at night that wakes you up from your sleep:     Blood clot in your veins:    Leg swelling:  x       Pulmonary    Oxygen at home:    Productive cough:     Wheezing:         Neurologic    Sudden weakness in arms or legs:     Sudden numbness in arms or legs:     Sudden onset of difficulty speaking or slurred speech:    Temporary loss of vision in one eye:     Problems with dizziness:         Gastrointestinal    Blood in stool:     Vomited blood:         Genitourinary    Burning when urinating:     Blood in urine:        Psychiatric    Major depression:         Hematologic    Bleeding problems:    Problems with blood clotting too easily:        Skin    Rashes or ulcers:        Constitutional    Fever or chills:      PHYSICAL EXAM:   There were no vitals filed for this visit.  GENERAL: The patient is a well-nourished female, in no acute distress. The vital signs are documented above. CARDIAC: There is a regular rate and rhythm.  VASCULAR: Palpable pedal pulses.  Sinus that was used bilateral saphenous veins which were small in diameter PULMONARY: Non-labored respirations  MUSCULOSKELETAL: There are no major deformities or cyanosis. NEUROLOGIC: No focal weakness or paresthesias are detected. SKIN: There are no ulcers or rashes noted. PSYCHIATRIC: The patient has a normal affect.  STUDIES:   I have reviewed the following reflux study:  +--------------+---------+------+-----------+------------+--------+  LEFT         Reflux NoRefluxReflux TimeDiameter cmsComments                          Yes                                   +--------------+---------+------+-----------+------------+--------+  CFV          no                                              +--------------+---------+------+-----------+------------+--------+  FV mid        no                                               +--------------+---------+------+-----------+------------+--------+  Popliteal    no                                              +--------------+---------+------+-----------+------------+--------+  GSV at SFJ    no                            .603              +--------------+---------+------+-----------+------------+--------+  GSV prox thigh          yes    >500 ms      .371              +--------------+---------+------+-----------+------------+--------+  GSV mid thigh no                            .450              +--------------+---------+------+-----------+------------+--------+  GSV dist thighno                            .551              +--------------+---------+------+-----------+------------+--------+  GSV at knee   no                            .277              +--------------+---------+------+-----------+------------+--------+  GSV prox calf no                            .246              +--------------+---------+------+-----------+------------+--------+  SSV Pop Fossa no                            .217              +--------------+---------+------+-----------+------------+--------+  SSV prox calf no                            .123              +--------------+---------+------+-----------+------------+--------+       MEDICAL ISSUES:   Leg swelling: The patient does have mild superficial venous reflux, however I do not feel that this is clinically significant.  Vein diameters are very small.  She does not have deep vein reflux.  I do not think her venous insufficiency is the primary contributor to her leg swelling.  She does not appear to have any cardiac or renal issues contributing to her swelling.  She has had abdominal ultrasound and CT scans that did not show any iliac vein compression.  Therefore, I suspect this is largely lymphedema.  I have recommended compression socks, and a  lymphedema pump as well as referral to lymphedema therapy.    Malvina Serene CLORE, MD, FACS [facility] [phone] Pager [phone]

## 2023-09-12 ENCOUNTER — Inpatient Hospital Stay: Payer: No Typology Code available for payment source | Attending: Hematology & Oncology

## 2023-09-12 DIAGNOSIS — D501 Sideropenic dysphagia: Secondary | ICD-10-CM | POA: Diagnosis not present

## 2023-09-12 DIAGNOSIS — I872 Venous insufficiency (chronic) (peripheral): Secondary | ICD-10-CM | POA: Insufficient documentation

## 2023-09-12 DIAGNOSIS — K909 Intestinal malabsorption, unspecified: Secondary | ICD-10-CM | POA: Diagnosis not present

## 2023-09-12 DIAGNOSIS — D508 Other iron deficiency anemias: Secondary | ICD-10-CM | POA: Diagnosis present

## 2023-09-12 DIAGNOSIS — M25471 Effusion, right ankle: Secondary | ICD-10-CM | POA: Diagnosis not present

## 2023-09-12 DIAGNOSIS — M25472 Effusion, left ankle: Secondary | ICD-10-CM | POA: Insufficient documentation

## 2023-09-12 LAB — IRON AND IRON BINDING CAPACITY (CC-WL,HP ONLY)
Iron: 129 ug/dL (ref 28–170)
Saturation Ratios: 44 % — ABNORMAL HIGH (ref 10.4–31.8)
TIBC: 294 ug/dL (ref 250–450)
UIBC: 165 ug/dL (ref 148–442)

## 2023-09-12 LAB — RETICULOCYTES
Immature Retic Fract: 5.1 % (ref 2.3–15.9)
RBC.: 4.42 MIL/uL (ref 3.87–5.11)
Retic Count, Absolute: 66.3 10*3/uL (ref 19.0–186.0)
Retic Ct Pct: 1.5 % (ref 0.4–3.1)

## 2023-09-12 LAB — CBC WITH DIFFERENTIAL (CANCER CENTER ONLY)
Abs Immature Granulocytes: 0.05 10*3/uL (ref 0.00–0.07)
Basophils Absolute: 0.1 10*3/uL (ref 0.0–0.1)
Basophils Relative: 1 %
Eosinophils Absolute: 0.2 10*3/uL (ref 0.0–0.5)
Eosinophils Relative: 4 %
HCT: 41.6 % (ref 36.0–46.0)
Hemoglobin: 13.8 g/dL (ref 12.0–15.0)
Immature Granulocytes: 1 %
Lymphocytes Relative: 35 %
Lymphs Abs: 1.8 10*3/uL (ref 0.7–4.0)
MCH: 30.7 pg (ref 26.0–34.0)
MCHC: 33.2 g/dL (ref 30.0–36.0)
MCV: 92.4 fL (ref 80.0–100.0)
Monocytes Absolute: 0.5 10*3/uL (ref 0.1–1.0)
Monocytes Relative: 10 %
Neutro Abs: 2.5 10*3/uL (ref 1.7–7.7)
Neutrophils Relative %: 49 %
Platelet Count: 232 10*3/uL (ref 150–400)
RBC: 4.5 MIL/uL (ref 3.87–5.11)
RDW: 11.8 % (ref 11.5–15.5)
WBC Count: 5.1 10*3/uL (ref 4.0–10.5)
nRBC: 0 % (ref 0.0–0.2)

## 2023-09-12 LAB — FERRITIN: Ferritin: 396 ng/mL — ABNORMAL HIGH (ref 11–307)

## 2023-09-14 ENCOUNTER — Ambulatory Visit
Admission: RE | Admit: 2023-09-14 | Discharge: 2023-09-14 | Disposition: A | Discharge status: Home / Self Care | Payer: No Typology Code available for payment source | Source: Ambulatory Visit | Attending: Obstetrics and Gynecology | Admitting: Obstetrics and Gynecology

## 2023-09-14 ENCOUNTER — Ambulatory Visit
Admission: RE | Admit: 2023-09-14 | Discharge: 2023-09-14 | Disposition: A | Discharge status: Home / Self Care | Payer: PRIVATE HEALTH INSURANCE | Source: Ambulatory Visit | Attending: Obstetrics and Gynecology | Admitting: Obstetrics and Gynecology

## 2023-09-14 DIAGNOSIS — N63 Unspecified lump in unspecified breast: Secondary | ICD-10-CM

## 2023-09-15 ENCOUNTER — Other Ambulatory Visit: Payer: Self-pay | Admitting: Obstetrics and Gynecology

## 2023-09-15 ENCOUNTER — Encounter: Payer: Self-pay | Admitting: Medical Oncology

## 2023-09-15 ENCOUNTER — Inpatient Hospital Stay (HOSPITAL_BASED_OUTPATIENT_CLINIC_OR_DEPARTMENT_OTHER): Payer: No Typology Code available for payment source | Admitting: Medical Oncology

## 2023-09-15 VITALS — BP 90/33 | HR 64 | Temp 98.5°F | Resp 18 | Ht 65.0 in | Wt 129.0 lb

## 2023-09-15 DIAGNOSIS — D501 Sideropenic dysphagia: Secondary | ICD-10-CM | POA: Diagnosis not present

## 2023-09-15 DIAGNOSIS — K909 Intestinal malabsorption, unspecified: Secondary | ICD-10-CM | POA: Diagnosis not present

## 2023-09-15 DIAGNOSIS — N6320 Unspecified lump in the left breast, unspecified quadrant: Secondary | ICD-10-CM

## 2023-09-15 DIAGNOSIS — D508 Other iron deficiency anemias: Secondary | ICD-10-CM | POA: Diagnosis not present

## 2023-09-15 DIAGNOSIS — D500 Iron deficiency anemia secondary to blood loss (chronic): Secondary | ICD-10-CM | POA: Diagnosis not present

## 2023-09-15 NOTE — Progress Notes (Signed)
Hematology and Oncology Follow Up Visit  CARMELLE MUETH [id number] 1966-07-18 58 y.o. 09/15/2023   Principle Diagnosis:  Iron deficiency anemia   Current Therapy:        IV iron as indicated    Interim History:  Ms. Belluomini is here today for follow-up.   Today she states that she did not sleep well last night and is a bit tired but overall good.   She continues to do heavy weight lifting 2 times per week. She has recently started a cardio class as well.  No syncope, chest pains or dizziness.  She did respond well to the Monoferric she received in July.  No recent palpitations.  She has not noted any blood loss. No bruising or petechiae.  No fever, chills, n/v, cough, rash, dizziness, SOB, chest pain, abdominal pain or changes in bowel or bladder habits.  She has intermittent mild swelling in her lower extremities/ankles due to venous insufficiency. She wears compression stockings for added support which do help.  No falls or syncope reported.  Appetite and hydration are good Wt Readings from Last 3 Encounters:  09/15/23 129 lb (58.5 kg)  09/05/23 128 lb 9.6 oz (58.3 kg)  06/06/23 126 lb 3.2 oz (57.2 kg)   ECOG Performance Status: 0 - Asymptomatic  Medications:  Allergies as of 09/15/2023       Reactions   Nickel Rash, Other (See Comments)   Sulfa Antibiotics Hives, Itching, Other (See Comments)   Vueway [gadopiclenol] Hives   Patient noted to have hives on back, inner thigh and head approximately 5 minutes after injection of Vueway Previously was given Multihance and tolerated it well         Medication List        Accurate as of September 15, 2023  3:00 PM. If you have any questions, ask your nurse or doctor.          STOP taking these medications    OVER THE COUNTER MEDICATION Stopped by: Rushie Chestnut       TAKE these medications    ascorbic acid 500 MG tablet Commonly known as: VITAMIN C Take 500 mg by mouth 2 (two) times daily.    Cytomel 5 MCG tablet Generic drug: liothyronine Take 2 tablets by mouth daily.   melatonin 3 MG Tabs tablet Take 3 mg by mouth at bedtime.   Minivelle 0.1 MG/24HR patch Generic drug: estradiol 1 patch 2 (two) times a week. What changed: Another medication with the same name was removed. Continue taking this medication, and follow the directions you see here. Changed by: Rushie Chestnut   OVER THE COUNTER MEDICATION Magnesium Glycinate 150mg   2 pills daily at bedtime   PROGESTERONE MICRONIZED PO Take 100 mg by mouth daily. Compounded pill   Tirosint 50 MCG Caps Generic drug: Levothyroxine Sodium Take 50 mcg by mouth daily.   VITAMIN D3 PO Take 2,000 Int'l Units/L by mouth daily. What changed: Another medication with the same name was removed. Continue taking this medication, and follow the directions you see here. Changed by: Rushie Chestnut   ZINC GLUCONATE PO Take 22 mg by mouth daily.        Allergies:  Allergies  Allergen Reactions   Nickel Rash and Other (See Comments)   Sulfa Antibiotics Hives, Itching and Other (See Comments)   Vueway [Gadopiclenol] Hives    Patient noted to have hives on back, inner thigh and head approximately 5 minutes after injection of Vueway  Previously  was given Multihance and tolerated it well     Past Medical History, Surgical history, Social history, and Family History were reviewed and updated.  Review of Systems: All other 10 point review of systems is negative.   Physical Exam:  height is 5\' 5"  (1.651 m) and weight is 129 lb (58.5 kg). Her oral temperature is 98.5 F (36.9 C). Her blood pressure is 90/33 (abnormal) and her pulse is 64. Her respiration is 18 and oxygen saturation is 100%.   Wt Readings from Last 3 Encounters:  09/15/23 129 lb (58.5 kg)  09/05/23 128 lb 9.6 oz (58.3 kg)  06/06/23 126 lb 3.2 oz (57.2 kg)    Ocular: Sclerae unicteric, pupils equal, round and reactive to light Ear-nose-throat:  Oropharynx clear, dentition fair Lymphatic: No cervical or supraclavicular adenopathy Lungs no rales or rhonchi, good excursion bilaterally Heart regular rate and rhythm, no murmur appreciated Abd soft, nontender, positive bowel sounds MSK no focal spinal tenderness, no joint edema Neuro: non-focal, well-oriented, appropriate affect  Lab Results  Component Value Date   WBC 5.1 09/12/2023   HGB 13.8 09/12/2023   HCT 41.6 09/12/2023   MCV 92.4 09/12/2023   PLT 232 09/12/2023   Lab Results  Component Value Date   FERRITIN 396 (H) 09/12/2023   IRON 129 09/12/2023   TIBC 294 09/12/2023   UIBC 165 09/12/2023   IRONPCTSAT 44 (H) 09/12/2023   Lab Results  Component Value Date   RETICCTPCT 1.5 09/12/2023   RBC 4.42 09/12/2023   No results found for: "KPAFRELGTCHN", "LAMBDASER", "KAPLAMBRATIO" Lab Results  Component Value Date   IGGSERUM 803 02/12/2022   IGA 253 02/12/2022   IGMSERUM 129 02/12/2022   No results found for: "TOTALPROTELP", "ALBUMINELP", "A1GS", "A2GS", "BETS", "BETA2SER", "GAMS", "MSPIKE", "SPEI"   Chemistry      Component Value Date/Time   NA 138 05/09/2023 0942   K 4.5 05/09/2023 0942   CL 103 05/09/2023 0942   CO2 30 05/09/2023 0942   BUN 21 (H) 05/09/2023 0942   CREATININE 0.63 05/09/2023 0942      Component Value Date/Time   CALCIUM 9.0 05/09/2023 0942   CALCIUM 9.0 01/06/2023 0901   ALKPHOS 72 05/09/2023 0942   AST 17 05/09/2023 0942   ALT 24 05/09/2023 0942   BILITOT 0.5 05/09/2023 4098     Encounter Diagnoses  Name Primary?   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 Jeansonne is a very nice 58 yo caucasian female with history of IDA.   Labs from 01/13 show iron saturation of 44%, ferritin of 396 (down from 440 s/p monoferric). Retics normal. Hgb normal at 13.8. She responded well to the monoferric. Iron levels expected to  continue trended down with time.   RTC 4 months labs (CBC, iron, ferritin, retic) RTC 4 months +_1-7 days Janetta Hora, PA-C 1/16/20253:00 PM

## 2023-12-06 ENCOUNTER — Encounter: Payer: Self-pay | Admitting: Cardiovascular Disease

## 2023-12-06 ENCOUNTER — Ambulatory Visit (INDEPENDENT_AMBULATORY_CARE_PROVIDER_SITE_OTHER)

## 2023-12-06 ENCOUNTER — Ambulatory Visit: Attending: Cardiovascular Disease | Admitting: Cardiovascular Disease

## 2023-12-06 VITALS — BP 112/68 | HR 65 | Ht 60.5 in | Wt 125.4 lb

## 2023-12-06 DIAGNOSIS — R600 Localized edema: Secondary | ICD-10-CM | POA: Insufficient documentation

## 2023-12-06 DIAGNOSIS — R002 Palpitations: Secondary | ICD-10-CM | POA: Diagnosis not present

## 2023-12-06 DIAGNOSIS — R931 Abnormal findings on diagnostic imaging of heart and coronary circulation: Secondary | ICD-10-CM

## 2023-12-06 DIAGNOSIS — E782 Mixed hyperlipidemia: Secondary | ICD-10-CM | POA: Diagnosis not present

## 2023-12-06 NOTE — Progress Notes (Signed)
 12/06/2023 Beckey Polkowski [place]   Dec 31, 1965  [id number]  Primary Physician Cleatis Polka., MD Primary Cardiologist: Runell Gess MD Nicholes Calamity, [place]  HPI:  Ann Calhoun is a 58 y.o.   thin-appearing married Caucasian female mother of 1 daughter who is a Holiday representative in [employer].  Her husband ,Osker Mason , is a urologist here in town.  She is self-referred because of recently performed coronary calcium score which was 127.  I last saw her in the office 02/18/2022.  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  Her only cardiac risk factor is mild hyperlipidemia.  She also has #Hashimoto's thyroiditis.  There is no family history of heart disease.  She is never had a heart attack or stroke.  She is pretty active.  She denies chest pain or shortness of breath.  She had a coronary calcium score performed 01/04/2022 which was 127 in the RCA territory.    Since I saw her 2 years ago she has developed lower extremity edema over the last year principally towards the end of the day.  Edema resolves after sleeping at night.  She really denies symptoms of heart failure though.  She has seen Dr. Myra Gianotti, vascular surgery, who is ruled out venous reflux.  She was referred to a lymphedema expert who did not think this was her issue and is coming back to see me to rule out a cardiovascular etiology.  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   Current Meds  Medication Sig   ascorbic acid (VITAMIN C) 500 MG tablet Take 500 mg by mouth 2 (two) times daily.   Cholecalciferol (VITAMIN D3 PO) Take 2,000 Int'l Units/L by mouth daily.   CYTOMEL 5 MCG tablet Take 2 tablets by mouth daily.   melatonin 3 MG TABS tablet Take 3 mg by mouth at bedtime.   MINIVELLE 0.1 MG/24HR patch 1 patch 2 (two) times a week.   OVER THE COUNTER MEDICATION Magnesium Glycinate 150mg   2 pills daily at bedtime   PROGESTERONE MICRONIZED PO Take 100  mg by mouth daily. Compounded pill   TIROSINT 50 MCG CAPS Take 50 mcg by mouth daily.   ZINC GLUCONATE PO Take 22 mg by mouth daily.     Allergies  Allergen Reactions   Nickel Rash and Other (See Comments)   Sulfa Antibiotics Hives, Itching and Other (See Comments)   Vueway [Gadopiclenol] Hives    Patient noted to have hives on back, inner thigh and head approximately 5 minutes after injection of Vueway  Previously was given Multihance and tolerated it well     Social History   Socioeconomic History   Marital status: Married    Spouse name: Not on file   Number of children: Not on file   Years of education: Not on file   Highest education level: Not on file  Occupational History   Not on file  Tobacco Use   Smoking status: Former    Current packs/day: 0.00    Types: Cigarettes    Quit date: 12/30/1995    Years since quitting: 27.9   Smokeless tobacco: Never  Vaping Use   Vaping status: Never Used  Substance and Sexual Activity   Alcohol use: Yes    Alcohol/week: 4.0 standard drinks of alcohol    Types: 4 Glasses of wine per week    Comment: not  currently   Drug use: No   Sexual activity:  Not on file  Other Topics Concern   Not on file  Social History Narrative   Married to Dr. Alfredo Martinez   One daughter   Social Drivers of Health   Financial Resource Strain: Not on file  Food Insecurity: Not on file  Transportation Needs: Not on file  Physical Activity: Not on file  Stress: Not on file  Social Connections: Unknown (04/08/2022)   Received from [facility], [facility]   Social Network    Social Network: Not on file  Intimate Partner Violence: Unknown (04/08/2022)   [facility], [facility]   HITS    Physically Hurt: Not on file    Insult or Talk Down To: Not on file    Threaten Physical Harm: Not on file    Scream or Curse: Not on file     Review of Systems: General: negative for chills, fever, night sweats or weight changes.   Cardiovascular: negative for chest pain, dyspnea on exertion, edema, orthopnea, palpitations, paroxysmal nocturnal dyspnea or shortness of breath Dermatological: negative for rash Respiratory: negative for cough or wheezing Urologic: negative for hematuria Abdominal: negative for nausea, vomiting, diarrhea, bright red blood per rectum, melena, or hematemesis Neurologic: negative for visual changes, syncope, or dizziness All other systems reviewed and are otherwise negative except as noted above.    Blood pressure 112/68, pulse 65, height 5' 0.5" (1.537 m), weight 125 lb 6.4 oz (56.9 kg), last menstrual period 05/01/2014, SpO2 99%.  General appearance: alert and no distress Neck: no adenopathy, no carotid bruit, no JVD, supple, symmetrical, trachea midline, and thyroid not enlarged, symmetric, no tenderness/mass/nodules Lungs: clear to auscultation bilaterally Heart: regular rate and rhythm, S1, S2 normal, no murmur, click, rub or gallop Extremities: extremities normal, atraumatic, no cyanosis or edema Pulses: 2+ and symmetric Skin: Skin color, texture, turgor normal. No rashes or lesions Neurologic: Grossly normal  EKG EKG Interpretation Date/Time:  Tuesday December 06 2023 09:15:17 EDT Ventricular Rate:  65 PR Interval:  118 QRS Duration:  86 QT Interval:  386 QTC Calculation: 401 R Axis:   76  Text Interpretation: Normal sinus rhythm ST & T wave abnormality, consider inferior ischemia No previous ECGs available Confirmed by Nanetta Batty [id number]) on 12/06/2023 9:26:52 AM    ASSESSMENT AND PLAN:   Hyperlipidemia History of hyperlipidemia not on statin therapy with lipid profile performed 12/05/23 revealing total cholesterol 183, LDL of 96 and HDL 72.  Her particle number was 1400 and her LP(a) was 23.  She did have an elevated coronary calcium score of 127 but did not wish to be on statin therapy, preferred a more natural intervention.  Elevated coronary artery calcium  score Elevated coronary calcium score of 127 all of which was in the RCA territory performed 01/04/2022.  She is completely asymptomatic.  Bilateral lower extremity edema Patient has noticed bilateral lower extremity edema for the last year.  It is more noticeable towards the end of the day and resolves in the morning after she slept at night.  She has seen Dr. Myra Gianotti, vascular surgery, who has ruled out venous reflux.  She was referred to a specialist in lymphedema did not think that this was the cause.  She is see me back to rule out a cardiovascular etiology.  I am going to get a 2D echo to further evaluate.     Runell Gess MD FACP,FACC,FAHA, [facility] 12/06/2023 9:46 AM

## 2023-12-06 NOTE — Patient Instructions (Signed)
 Testing/Procedures:  Your physician has requested that you have an echocardiogram. Echocardiography is a painless test that uses sound waves to create images of your heart. It provides your doctor with information about the size and shape of your heart and how well your heart's chambers and valves are working. This procedure takes approximately one hour. There are no restrictions for this procedure. Please do NOT wear cologne, perfume, aftershave, or lotions (deodorant is allowed). Please arrive 15 minutes prior to your appointment time.  Please note: We ask at that you not bring children with you during ultrasound (echo/ vascular) testing. Due to room size and safety concerns, children are not allowed in the ultrasound rooms during exams. Our front office staff cannot provide observation of children in our lobby area while testing is being conducted. An adult accompanying a patient to their appointment will only be allowed in the ultrasound room at the discretion of the ultrasound technician under special circumstances. We apologize for any inconvenience. [address]   ZIO XT- Long Term Monitor Instructions  Your physician has requested you wear a ZIO patch monitor for 14 days.  This is a single patch monitor. [employer] supplies one patch monitor per enrollment. Additional stickers are not available. Please do not apply patch if you will be having a Nuclear Stress Test,  Echocardiogram, Cardiac CT, MRI, or Chest Xray during the period you would be wearing the  monitor. The patch cannot be worn during these tests. You cannot remove and re-apply the  ZIO XT patch monitor.  Your ZIO patch monitor will be mailed 3 day USPS to your address on file. It may take 3-5 days  to receive your monitor after you have been enrolled.  Once you have received your monitor, please review the enclosed instructions. Your monitor  has already been registered assigning a specific monitor serial # to  you.  Billing and Patient Assistance Program Information  We have supplied [employer] with any of your insurance information on file for billing purposes. [employer] offers a sliding scale Patient Assistance Program for patients that do not have  insurance, or whose insurance does not completely cover the cost of the ZIO monitor.  You must apply for the Patient Assistance Program to qualify for this discounted rate.  To apply, please call [employer] at [phone], select option 4, select option 2, ask to apply for  Patient Assistance Program. Meredeth Ide will ask your household income, and how many people  are in your household. They will quote your out-of-pocket cost based on that information.  [employer] will also be able to set up a 10-month, interest-free payment plan if needed.  Applying the monitor   Shave hair from upper left chest.  Hold abrader disc by orange tab. Rub abrader in 40 strokes over the upper left chest as  indicated in your monitor instructions.  Clean area with 4 enclosed alcohol pads. Let dry.  Apply patch as indicated in monitor instructions. Patch will be placed under collarbone on left  side of chest with arrow pointing upward.  Rub patch adhesive wings for 2 minutes. Remove white label marked "1". Remove the white  label marked "2". Rub patch adhesive wings for 2 additional minutes.  While looking in a mirror, press and release button in center of patch. A small green light will  flash 3-4 times. This will be your only indicator that the monitor has been turned on.  Do not shower for the first 24 hours. You may shower after the  first 24 hours.  Press the button if you feel a symptom. You will hear a small click. Record Date, Time and  Symptom in the Patient Logbook.  When you are ready to remove the patch, follow instructions on the last 2 pages of Patient  Logbook. Stick patch monitor onto the last page of Patient Logbook.  Place Patient Logbook in the blue and white box.  Use locking tab on box and tape box closed  securely. The blue and white box has prepaid postage on it. Please place it in the mailbox as  soon as possible. Your physician should have your test results approximately 7 days after the  monitor has been mailed back to [facility].  Call [facility] Customer Care at [phone] if you have questions regarding  your ZIO XT patch monitor. Call them immediately if you see an orange light blinking on your  monitor.  If your monitor falls off in less than 4 days, contact our Monitor department at [phone].  If your monitor becomes loose or falls off after 4 days call [employer] at [phone] for  suggestions on securing your monitor   Follow-Up: At [facility], you and your health needs are our priority.  As part of our continuing mission to provide you with exceptional heart care, our providers are all part of one team.  This team includes your primary Cardiologist (physician) and Advanced Practice Providers or APPs (Physician Assistants and Nurse Practitioners) who all work together to provide you with the care you need, when you need it.  Your next appointment:   AS NEEDED       1st Floor: - Lobby - Registration  - Pharmacy  - Lab - Cafe  2nd Floor: - PV Lab - Diagnostic Testing (echo, CT, nuclear med)  3rd Floor: - Vacant  4th Floor: - TCTS (cardiothoracic surgery) - AFib Clinic - Structural Heart Clinic - Vascular Surgery  - Vascular Ultrasound  5th Floor: - HeartCare Cardiology (general and EP) - Clinical Pharmacy for coumadin, hypertension, lipid, weight-loss medications, and med management appointments    Valet parking services will be available as well.

## 2023-12-06 NOTE — Progress Notes (Unsigned)
 Enrolled for [employer] to mail a ZIO XT long term holter monitor to the patients address on file.

## 2023-12-06 NOTE — Assessment & Plan Note (Signed)
 Elevated coronary calcium score of 127 all of which was in the RCA territory performed 01/04/2022.  She is completely asymptomatic.

## 2023-12-06 NOTE — Assessment & Plan Note (Addendum)
 History of hyperlipidemia not on statin therapy with lipid profile performed 12/05/23 revealing total cholesterol 183, LDL of 96 and HDL 72.  Her particle number was 1400 and her LP(a) was 23.  She did have an elevated coronary calcium score of 127 but did not wish to be on statin therapy, preferred a more natural intervention.

## 2023-12-06 NOTE — Assessment & Plan Note (Signed)
 Patient has noticed bilateral lower extremity edema for the last year.  It is more noticeable towards the end of the day and resolves in the morning after she slept at night.  She has seen Dr. Myra Gianotti, vascular surgery, who has ruled out venous reflux.  She was referred to a specialist in lymphedema did not think that this was the cause.  She is see me back to rule out a cardiovascular etiology.  I am going to get a 2D echo to further evaluate.

## 2024-01-04 ENCOUNTER — Other Ambulatory Visit: Payer: Self-pay | Admitting: Endocrinology

## 2024-01-04 ENCOUNTER — Encounter: Payer: Self-pay | Admitting: Obstetrics and Gynecology

## 2024-01-04 DIAGNOSIS — R7989 Other specified abnormal findings of blood chemistry: Secondary | ICD-10-CM

## 2024-01-06 ENCOUNTER — Telehealth: Payer: Self-pay

## 2024-01-06 DIAGNOSIS — R002 Palpitations: Secondary | ICD-10-CM

## 2024-01-06 MED ORDER — PREDNISONE 50 MG PO TABS: ORAL_TABLET | ORAL | 0 refills | Status: DC

## 2024-01-09 ENCOUNTER — Ambulatory Visit (HOSPITAL_COMMUNITY): Attending: Cardiovascular Disease

## 2024-01-09 DIAGNOSIS — I2510 Atherosclerotic heart disease of native coronary artery without angina pectoris: Secondary | ICD-10-CM

## 2024-01-09 DIAGNOSIS — R931 Abnormal findings on diagnostic imaging of heart and coronary circulation: Secondary | ICD-10-CM | POA: Insufficient documentation

## 2024-01-09 LAB — ECHOCARDIOGRAM COMPLETE
Area-P 1/2: 4.02 cm2
S' Lateral: 3.4 cm

## 2024-01-12 ENCOUNTER — Ambulatory Visit: Payer: Self-pay

## 2024-01-13 ENCOUNTER — Inpatient Hospital Stay: Payer: No Typology Code available for payment source

## 2024-01-16 ENCOUNTER — Other Ambulatory Visit: Payer: Self-pay

## 2024-01-17 ENCOUNTER — Ambulatory Visit: Payer: Self-pay | Admitting: Medical Oncology

## 2024-01-17 ENCOUNTER — Inpatient Hospital Stay: Payer: Self-pay | Attending: Psychology

## 2024-01-17 ENCOUNTER — Other Ambulatory Visit

## 2024-01-17 DIAGNOSIS — D501 Sideropenic dysphagia: Secondary | ICD-10-CM | POA: Insufficient documentation

## 2024-01-17 DIAGNOSIS — D500 Iron deficiency anemia secondary to blood loss (chronic): Secondary | ICD-10-CM | POA: Insufficient documentation

## 2024-01-17 DIAGNOSIS — K909 Intestinal malabsorption, unspecified: Secondary | ICD-10-CM

## 2024-01-17 LAB — CBC
HCT: 40.8 % (ref 36.0–46.0)
Hemoglobin: 13.2 g/dL (ref 12.0–15.0)
MCH: 29.9 pg (ref 26.0–34.0)
MCHC: 32.4 g/dL (ref 30.0–36.0)
MCV: 92.5 fL (ref 80.0–100.0)
Platelets: 206 10*3/uL (ref 150–400)
RBC: 4.41 MIL/uL (ref 3.87–5.11)
RDW: 11.9 % (ref 11.5–15.5)
WBC: 4.6 10*3/uL (ref 4.0–10.5)
nRBC: 0 % (ref 0.0–0.2)

## 2024-01-17 LAB — RETIC PANEL
Immature Retic Fract: 6.7 % (ref 2.3–15.9)
RBC.: 4.41 MIL/uL (ref 3.87–5.11)
Retic Count, Absolute: 71.4 10*3/uL (ref 19.0–186.0)
Retic Ct Pct: 1.6 % (ref 0.4–3.1)
Reticulocyte Hemoglobin: 34 pg (ref >27.9)

## 2024-01-17 LAB — IRON AND IRON BINDING CAPACITY (CC-WL,HP ONLY)
Iron: 132 ug/dL (ref 28–170)
Saturation Ratios: 42 % — ABNORMAL HIGH (ref 10.4–31.8)
TIBC: 314 ug/dL (ref 250–450)
UIBC: 182 ug/dL (ref 148–442)

## 2024-01-17 LAB — FERRITIN: Ferritin: 494 ng/mL — ABNORMAL HIGH (ref 11–307)

## 2024-01-18 ENCOUNTER — Other Ambulatory Visit

## 2024-01-18 ENCOUNTER — Ambulatory Visit
Admission: RE | Admit: 2024-01-18 | Discharge: 2024-01-18 | Disposition: A | Discharge status: Home / Self Care | Source: Ambulatory Visit | Attending: Endocrinology | Admitting: Endocrinology

## 2024-01-18 DIAGNOSIS — R7989 Other specified abnormal findings of blood chemistry: Secondary | ICD-10-CM

## 2024-01-18 MED ORDER — GADOPICLENOL 0.5 MMOL/ML IV SOLN
5.0000 mL | Freq: Once | INTRAVENOUS | Status: AC | PRN
  Administered 2024-01-18: 5 mL via INTRAVENOUS

## 2024-01-19 ENCOUNTER — Ambulatory Visit: Payer: No Typology Code available for payment source | Admitting: Medical Oncology

## 2024-01-20 ENCOUNTER — Ambulatory Visit: Payer: No Typology Code available for payment source | Admitting: Family

## 2024-01-24 ENCOUNTER — Inpatient Hospital Stay (HOSPITAL_BASED_OUTPATIENT_CLINIC_OR_DEPARTMENT_OTHER): Admitting: Medical Oncology

## 2024-01-24 ENCOUNTER — Encounter: Payer: Self-pay | Admitting: Medical Oncology

## 2024-01-24 VITALS — BP 96/55 | HR 73 | Temp 98.0°F | Resp 18 | Ht 60.0 in | Wt 125.5 lb

## 2024-01-24 DIAGNOSIS — D501 Sideropenic dysphagia: Secondary | ICD-10-CM

## 2024-01-24 DIAGNOSIS — D500 Iron deficiency anemia secondary to blood loss (chronic): Secondary | ICD-10-CM | POA: Diagnosis not present

## 2024-01-24 NOTE — Progress Notes (Signed)
 Hematology and Oncology Follow Up Visit  Ann Calhoun [id number] 02/05/1966 58 y.o. 01/24/2024   Principle Diagnosis:  Iron deficiency anemia   Current Therapy:        IV iron as indicated    Interim History:  Ann Calhoun is here today for follow-up.   Today she states that she is well but does mention that she was found to have a suspected meningioma on her recent MRI brain on 01/18/2024. She is seeing a Retail buyer tomorrow for further evaluation for this. She is also being evaluated for elevated cortisol levels.   She continues to do heavy weight lifting 2 times per week.  No syncope, chest pains or dizziness.  She did respond well to the Monoferric  she received in July.  No recent palpitations.  She has not noted any blood loss. No bruising or petechiae.  No fever, chills, n/v, cough, rash, dizziness, SOB, chest pain, abdominal pain or changes in bowel or bladder habits.  She has intermittent mild swelling in her lower extremities/ankles due to venous insufficiency. She wears compression stockings for added support which do help.  No falls or syncope reported.  Appetite and hydration are good Wt Readings from Last 3 Encounters:  01/24/24 125 lb 8 oz (56.9 kg)  12/06/23 125 lb 6.4 oz (56.9 kg)  09/15/23 129 lb (58.5 kg)   ECOG Performance Status: 0 - Asymptomatic  Medications:  Allergies as of 01/24/2024       Reactions   Nickel Rash, Other (See Comments)   Sulfa Antibiotics Hives, Itching, Other (See Comments)   Vueway  [gadopiclenol ] Hives   Patient noted to have 3 hives on back, inner thigh and head approximately 5 minutes after injection of Vueway  Previously was given Multihance  and tolerated it well. 01/18/24 took Bendaryl 50mg  PO one hour before today's scan and tolerated well.        Medication List        Accurate as of Jan 24, 2024  2:47 PM. If you have any questions, ask your nurse or doctor.          STOP taking these medications     predniSONE  50 MG tablet Commonly known as: DELTASONE  Stopped by: Ann Calhoun   ZINC GLUCONATE PO Stopped by: Ann Calhoun       TAKE these medications    ascorbic acid 500 MG tablet Commonly known as: VITAMIN C Take 500 mg by mouth 2 (two) times daily.   Cytomel 5 MCG tablet Generic drug: liothyronine Take 2 tablets by mouth daily.   melatonin 3 MG Tabs tablet Take 3 mg by mouth at bedtime.   Minivelle 0.075 MG/24HR Generic drug: estradiol Place 1 patch onto the skin 2 (two) times a week. What changed: Another medication with the same name was removed. Continue taking this medication, and follow the directions you see here. Changed by: Ann Calhoun   OVER THE COUNTER MEDICATION Magnesium Glycinate 150mg   2 pills daily at bedtime   PROGESTERONE MICRONIZED PO Take 100 mg by mouth daily. Compounded pill   Tirosint 75 MCG Caps Generic drug: Levothyroxine Sodium Take 1 capsule by mouth every morning. What changed: Another medication with the same name was removed. Continue taking this medication, and follow the directions you see here. Changed by: Ann Calhoun   VITAMIN D3 PO Take 2,000 Int'l Units/L by mouth daily.        Allergies:  Allergies  Allergen Reactions   Nickel Rash and Other (See  Comments)   Sulfa Antibiotics Hives, Itching and Other (See Comments)   Vueway  [Gadopiclenol ] Hives    Patient noted to have 3 hives on back, inner thigh and head approximately 5 minutes after injection of Vueway  Previously was given Multihance  and tolerated it well. 01/18/24 took Bendaryl 50mg  PO one hour before today's scan and tolerated well.    Past Medical History, Surgical history, Social history, and Family History were reviewed and updated.  Review of Systems: All other 10 point review of systems is negative.   Physical Exam:  height is 5' (1.524 m) and weight is 125 lb 8 oz (56.9 kg). Her oral temperature is 98 F (36.7 C). Her blood  pressure is 96/55 (abnormal) and her pulse is 73. Her respiration is 18 and oxygen saturation is 100%.   Wt Readings from Last 3 Encounters:  01/24/24 125 lb 8 oz (56.9 kg)  12/06/23 125 lb 6.4 oz (56.9 kg)  09/15/23 129 lb (58.5 kg)    Ocular: Sclerae unicteric, pupils equal, round and reactive to light Ear-nose-throat: Oropharynx clear, dentition fair Lymphatic: No cervical or supraclavicular adenopathy Lungs no rales or rhonchi, good excursion bilaterally Heart regular rate and rhythm, no murmur appreciated Abd soft, nontender, positive bowel sounds MSK no focal spinal tenderness, no joint edema Neuro: non-focal, well-oriented, appropriate affect  Lab Results  Component Value Date   WBC 4.6 01/17/2024   HGB 13.2 01/17/2024   HCT 40.8 01/17/2024   MCV 92.5 01/17/2024   PLT 206 01/17/2024   Lab Results  Component Value Date   FERRITIN 494 (H) 01/17/2024   IRON 132 01/17/2024   TIBC 314 01/17/2024   UIBC 182 01/17/2024   IRONPCTSAT 42 (H) 01/17/2024   Lab Results  Component Value Date   RETICCTPCT 1.6 01/17/2024   RBC 4.41 01/17/2024   No results found for: "KPAFRELGTCHN", "LAMBDASER", "KAPLAMBRATIO" Lab Results  Component Value Date   IGGSERUM 803 02/12/2022   IGA 253 02/12/2022   IGMSERUM 129 02/12/2022   No results found for: "TOTALPROTELP", "ALBUMINELP", "A1GS", "A2GS", "BETS", "BETA2SER", "GAMS", "MSPIKE", "SPEI"   Chemistry      Component Value Date/Time   NA 138 05/09/2023 0942   K 4.5 05/09/2023 0942   CL 103 05/09/2023 0942   CO2 30 05/09/2023 0942   BUN 21 (H) 05/09/2023 0942   CREATININE 0.63 05/09/2023 0942      Component Value Date/Time   CALCIUM 9.0 05/09/2023 0942   CALCIUM 9.0 01/06/2023 0901   ALKPHOS 72 05/09/2023 0942   AST 17 05/09/2023 0942   ALT 24 05/09/2023 0942   BILITOT 0.5 05/09/2023 1610     Encounter Diagnoses  Name Primary?   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 is a very nice 58 yo caucasian female with history of IDA. She has been treated with monoferric  which worked well for her.   Labs from 05/20 show iron saturation of 45%, ferritin of 494. Retics normal. Hgb normal at 13.2.     RTC 6 months labs (CBC, iron, ferritin, retic) RTC 6 months +_1-7 days Berwyn Broker, PA-C 5/27/20252:47 PM

## 2024-01-25 ENCOUNTER — Other Ambulatory Visit: Payer: Self-pay | Admitting: Endocrinology

## 2024-01-25 DIAGNOSIS — R7989 Other specified abnormal findings of blood chemistry: Secondary | ICD-10-CM

## 2024-01-31 ENCOUNTER — Other Ambulatory Visit: Payer: Self-pay | Admitting: Endocrinology

## 2024-01-31 ENCOUNTER — Ambulatory Visit
Admission: RE | Admit: 2024-01-31 | Discharge: 2024-01-31 | Disposition: A | Discharge status: Home / Self Care | Source: Ambulatory Visit | Attending: Endocrinology | Admitting: Endocrinology

## 2024-01-31 DIAGNOSIS — R7989 Other specified abnormal findings of blood chemistry: Secondary | ICD-10-CM

## 2024-01-31 MED ORDER — IOPAMIDOL (ISOVUE-300) INJECTION 61%
100.0000 mL | Freq: Once | INTRAVENOUS | Status: AC | PRN
  Administered 2024-01-31: 100 mL via INTRAVENOUS

## 2024-02-28 ENCOUNTER — Encounter: Payer: Self-pay | Admitting: Cardiovascular Disease

## 2024-02-28 ENCOUNTER — Ambulatory Visit: Payer: Self-pay | Attending: Cardiology | Admitting: Cardiovascular Disease

## 2024-02-28 VITALS — BP 96/58 | HR 80 | Ht 65.5 in | Wt 126.0 lb

## 2024-02-28 DIAGNOSIS — R931 Abnormal findings on diagnostic imaging of heart and coronary circulation: Secondary | ICD-10-CM

## 2024-02-28 DIAGNOSIS — R600 Localized edema: Secondary | ICD-10-CM | POA: Diagnosis not present

## 2024-02-28 DIAGNOSIS — E782 Mixed hyperlipidemia: Secondary | ICD-10-CM

## 2024-02-28 NOTE — Assessment & Plan Note (Signed)
 History of hyperlipidemia not on statin therapy with lipid profile performed 05/26/2023 revealing a total cholesterol 186, LDL 91 and HDL of 66.  Her coronary calcium score was mildly elevated at 127.  We talked about statin therapy with the patient wishes to pursue a more natural alternative.

## 2024-02-28 NOTE — Assessment & Plan Note (Signed)
 Bilateral lower extremity edema wearing TED stockings.  Her 2D echo was essentially normal.  She has a very soft blood pressure at baseline and was offered a diuretic which I suggested would probably cause worsening blood pressure and symptoms.  She has been seen by Dr. Serene to rule out venous reflux and a lymphedema expert who thought that she did not have lymphedema.  There is a question of Hashimoto's thyroiditis.

## 2024-02-28 NOTE — Assessment & Plan Note (Signed)
 Elevated coronary calcium score of 127 performed 01/04/2022 all in the RCA territory.  She is asymptomatic and very active.

## 2024-02-28 NOTE — Patient Instructions (Signed)
 Medication Instructions:  Your physician recommends that you continue on your current medications as directed. Please refer to the Current Medication list given to you today.  *If you need a refill on your cardiac medications before your next appointment, please call your pharmacy*  Lab Work: If you have labs (blood work) drawn today and your tests are completely normal, you will receive your results only by: MyChart Message (if you have MyChart) OR A paper copy in the mail If you have any lab test that is abnormal or we need to change your treatment, we will call you to review the results.  Testing/Procedures: None ordered today.  Follow-Up: At [facility], you and your health needs are our priority.  As part of our continuing mission to provide you with exceptional heart care, our providers are all part of one team.  This team includes your primary Cardiologist (physician) and Advanced Practice Providers or APPs (Physician Assistants and Nurse Practitioners) who all work together to provide you with the care you need, when you need it.  Your next appointment:   1 year(s)  Provider:   Dorn Lesches, MD    We recommend signing up for the patient portal called MyChart.  Sign up information is provided on this After Visit Summary.  MyChart is used to connect with patients for Virtual Visits (Telemedicine).  Patients are able to view lab/test results, encounter notes, upcoming appointments, etc.  Non-urgent messages can be sent to your provider as well.   To learn more about what you can do with MyChart, go to [email or web address].

## 2024-02-28 NOTE — Progress Notes (Signed)
 02/28/2024 Ann Calhoun   05-01-1966  [id number]  Primary Physician Loreli Elsie JONETTA Mickey., MD Primary Cardiologist: Dorn JINNY Lesches MD [facility] [place], [place]  HPI:  Ann Calhoun is a 58 y.o.    thin-appearing married Caucasian female mother of 1 daughter who just graduated college and is living in [place]..  Her husband ,Glendia Comes , is a Insurance underwriter here in town.  She is self-referred because of recently performed coronary calcium score which was 127.  I last saw her in the office 12/06/23.  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  Her only cardiac risk factor is mild hyperlipidemia.  She also has #Hashimoto's thyroiditis.  There is no family history of heart disease.  She is never had a heart attack or stroke.  She is pretty active.  She denies chest pain or shortness of breath.  She had a coronary calcium score performed 01/04/2022 which was 127 in the RCA territory.     She had developed lower extremity edema over the last year principally towards the end of the day when I saw her last..  Edema resolves after sleeping at night.  She really denies symptoms of heart failure though.  She has seen Dr. Serene, vascular surgery, who is ruled out venous reflux.  She was referred to a lymphedema expert who did not think this was her issue and is coming back to see me to rule out a cardiovascular etiology.  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  Since I saw her 3 months ago I did get a 2D echo which was essentially normal with normal diastolic function as well and no valvular abnormalities.  She wore a Zio patch for 2 weeks that showed only occasional PACs and PVCs.  She apparently has been recently diagnosed with Hashimoto's thyroiditis.  She saw her gynecologist, Dr. Mat  who adjusted her estrogen which improved her palpitations.   Current Meds  Medication Sig   ascorbic acid (VITAMIN C) 500 MG tablet  Take 500 mg by mouth 2 (two) times daily.   Cholecalciferol (VITAMIN D3 PO) Take 2,000 Int'l Units/L by mouth daily.   CYTOMEL 5 MCG tablet Take 2 tablets by mouth daily.   KLOR-CON 20 MEQ packet Take 20 mEq by mouth daily.   melatonin 3 MG TABS tablet Take 3 mg by mouth at bedtime.   MINIVELLE 0.075 MG/24HR Place 1 patch onto the skin 2 (two) times a week.   progesterone (PROMETRIUM) 100 MG capsule Take 100 mg by mouth daily.   PROGESTERONE MICRONIZED PO Take 100 mg by mouth daily. Compounded pill (Patient taking differently: Take 100 mg by mouth daily. Compounded pill. Per patient taking 200 mg)   TIROSINT 75 MCG CAPS Take 1 capsule by mouth every morning.   triamterene-hydrochlorothiazide (DYAZIDE) 37.5-25 MG capsule Take 1 capsule by mouth daily.     Allergies  Allergen Reactions   Iodine Other (See Comments)    Pt has Hashimoto's and iodine supplements are contraindicated.  Iodinated contrast if fine, per Dr. Jennefer. 01/27/24   Nickel Rash and Other (See Comments)   Sulfa Antibiotics Hives, Itching and Other (See Comments)   Vueway  [Gadopiclenol ] Hives    Patient noted to have 3 hives on back, inner thigh and head approximately 5 minutes after injection of Vueway  Previously was given Multihance  and tolerated it well. 01/18/24 took Bendaryl 50mg  PO one hour before today's scan and tolerated well.  Social History   Socioeconomic History   Marital status: Married    Spouse name: Not on file   Number of children: Not on file   Years of education: Not on file   Highest education level: Not on file  Occupational History   Not on file  Tobacco Use   Smoking status: Former    Current packs/day: 0.00    Types: Cigarettes    Quit date: 12/30/1995    Years since quitting: 28.1   Smokeless tobacco: Never  Vaping Use   Vaping status: Never Used  Substance and Sexual Activity   Alcohol use: Yes    Alcohol/week: 4.0 standard drinks of alcohol    Types: 4 Glasses of wine per week     Comment: not  currently   Drug use: No   Sexual activity: Not on file  Other Topics Concern   Not on file  Social History Narrative   Married to Dr. Glendia Elizabeth   One daughter   Social Drivers of Health   Financial Resource Strain: Not on file  Food Insecurity: Not on file  Transportation Needs: Not on file  Physical Activity: Not on file  Stress: Not on file  Social Connections: Unknown (04/08/2022)   [facility]   Social Network    Social Network: Not on file  Intimate Partner Violence: Unknown (04/08/2022)   Received from [facility]   HITS    Physically Hurt: Not on file    Insult or Talk Down To: Not on file    Threaten Physical Harm: Not on file    Scream or Curse: Not on file     Review of Systems: General: negative for chills, fever, night sweats or weight changes.  Cardiovascular: negative for chest pain, dyspnea on exertion, edema, orthopnea, palpitations, paroxysmal nocturnal dyspnea or shortness of breath Dermatological: negative for rash Respiratory: negative for cough or wheezing Urologic: negative for hematuria Abdominal: negative for nausea, vomiting, diarrhea, bright red blood per rectum, melena, or hematemesis Neurologic: negative for visual changes, syncope, or dizziness All other systems reviewed and are otherwise negative except as noted above.    Blood pressure (!) 96/58, pulse 80, height 5' 5.5 (1.664 m), weight 126 lb (57.2 kg), last menstrual period 05/01/2014, SpO2 95%.  General appearance: alert and no distress Neck: no adenopathy, no carotid bruit, no JVD, supple, symmetrical, trachea midline, and thyroid  not enlarged, symmetric, no tenderness/mass/nodules Lungs: clear to auscultation bilaterally Heart: regular rate and rhythm, S1, S2 normal, no murmur, click, rub or gallop Extremities: extremities normal, atraumatic, no cyanosis or edema Pulses: 2+ and symmetric Skin: Skin color, texture, turgor normal. No rashes or  lesions Neurologic: Grossly normal  EKG not performed today      ASSESSMENT AND PLAN:   Hyperlipidemia History of hyperlipidemia not on statin therapy with lipid profile performed 05/26/2023 revealing a total cholesterol 186, LDL 91 and HDL of 66.  Her coronary calcium score was mildly elevated at 127.  We talked about statin therapy with the patient wishes to pursue a more natural alternative.  Elevated coronary artery calcium score Elevated coronary calcium score of 127 performed 01/04/2022 all in the RCA territory.  She is asymptomatic and very active.  Bilateral lower extremity edema Bilateral lower extremity edema wearing TED stockings.  Her 2D echo was essentially normal.  She has a very soft blood pressure at baseline and was offered a diuretic which I suggested would probably cause worsening blood pressure and symptoms.  She  has been seen by Dr. Serene to rule out venous reflux and a lymphedema expert who thought that she did not have lymphedema.  There is a question of Hashimoto's thyroiditis.     Dorn DOROTHA Lesches MD FACP,FACC,FAHA, [facility] 02/28/2024 10:50 AM

## 2024-03-15 ENCOUNTER — Other Ambulatory Visit: Payer: Self-pay | Admitting: Obstetrics and Gynecology

## 2024-03-15 ENCOUNTER — Ambulatory Visit
Admission: RE | Admit: 2024-03-15 | Discharge: 2024-03-15 | Disposition: A | Discharge status: Home / Self Care | Source: Ambulatory Visit | Attending: Obstetrics and Gynecology | Admitting: Obstetrics and Gynecology

## 2024-03-15 DIAGNOSIS — N6320 Unspecified lump in the left breast, unspecified quadrant: Secondary | ICD-10-CM

## 2024-04-10 ENCOUNTER — Inpatient Hospital Stay: Payer: Self-pay | Attending: Psychology

## 2024-04-10 ENCOUNTER — Inpatient Hospital Stay (HOSPITAL_BASED_OUTPATIENT_CLINIC_OR_DEPARTMENT_OTHER): Payer: Self-pay | Admitting: Family

## 2024-04-10 VITALS — BP 85/47 | HR 73 | Temp 98.1°F | Resp 17 | Ht 65.25 in | Wt 127.8 lb

## 2024-04-10 DIAGNOSIS — D509 Iron deficiency anemia, unspecified: Secondary | ICD-10-CM | POA: Diagnosis present

## 2024-04-10 DIAGNOSIS — D500 Iron deficiency anemia secondary to blood loss (chronic): Secondary | ICD-10-CM | POA: Diagnosis not present

## 2024-04-10 DIAGNOSIS — D501 Sideropenic dysphagia: Secondary | ICD-10-CM

## 2024-04-10 DIAGNOSIS — R609 Edema, unspecified: Secondary | ICD-10-CM | POA: Diagnosis not present

## 2024-04-10 LAB — IRON AND IRON BINDING CAPACITY (CC-WL,HP ONLY)
Iron: 109 ug/dL (ref 28–170)
Saturation Ratios: 33 % — ABNORMAL HIGH (ref 10.4–31.8)
TIBC: 328 ug/dL (ref 250–450)
UIBC: 219 ug/dL

## 2024-04-10 LAB — CBC
HCT: 40.8 % (ref 36.0–46.0)
Hemoglobin: 13.3 g/dL (ref 12.0–15.0)
MCH: 29.7 pg (ref 26.0–34.0)
MCHC: 32.6 g/dL (ref 30.0–36.0)
MCV: 91.1 fL (ref 80.0–100.0)
Platelets: 227 K/uL (ref 150–400)
RBC: 4.48 MIL/uL (ref 3.87–5.11)
RDW: 11.9 % (ref 11.5–15.5)
WBC: 4.9 K/uL (ref 4.0–10.5)
nRBC: 0 % (ref 0.0–0.2)

## 2024-04-10 LAB — RETIC PANEL
Immature Retic Fract: 6.4 % (ref 2.3–15.9)
RBC.: 4.51 MIL/uL (ref 3.87–5.11)
Retic Count, Absolute: 60.4 K/uL (ref 19.0–186.0)
Retic Ct Pct: 1.3 % (ref 0.4–3.1)
Reticulocyte Hemoglobin: 33.8 pg (ref >27.9)

## 2024-04-10 LAB — FERRITIN: Ferritin: 480 ng/mL — ABNORMAL HIGH (ref 11–307)

## 2024-04-10 NOTE — Progress Notes (Signed)
 Hematology and Oncology Follow Up Visit  Ann Calhoun [id number] 12-06-1965 58 y.o. 04/10/2024   Principle Diagnosis:  Iron deficiency anemia   Current Therapy:        IV iron as indicated    Interim History:  Ann Calhoun is here today for follow-up. She is doing well but has had some issues with mild fluid retention around her abdomen as well as in the lower extremities.  She feels that this is related to her thyroid  function and has an appointment with a new endocrinologist coming up.  No pitting edema or redness noted.  She has been wearing compression stockings for added support.  No fever, chills, n/v, cough, rash, dizziness, SOB, chest pain, palpitations, abdominal pain or changes in bowel or bladder habits.  No numbness or tingling in her extremities at this time.  No falls or syncope.  She has not noted any blood loss. No bruising or petechiae.  Appetite and hydration are good. Weight is stable at 127 lbs.    ECOG Performance Status: 1 - Symptomatic but completely ambulatory  Medications:  Allergies as of 04/10/2024       Reactions   Iodine Other (See Comments)   Pt has Hashimoto's and iodine supplements are contraindicated.  Iodinated contrast if fine, per Dr. Jennefer. 01/27/24   Nickel Rash, Other (See Comments)   Sulfa Antibiotics Hives, Itching, Other (See Comments)   Vueway  [gadopiclenol ] Hives   Patient noted to have 3 hives on back, inner thigh and head approximately 5 minutes after injection of Vueway  Previously was given Multihance  and tolerated it well. 01/18/24 took Bendaryl 50mg  PO one hour before today's scan and tolerated well.        Medication List        Accurate as of April 10, 2024 10:02 AM. If you have any questions, ask your nurse or doctor.          ascorbic acid 500 MG tablet Commonly known as: VITAMIN C Take 500 mg by mouth 2 (two) times daily.   Cytomel 5 MCG tablet Generic drug: liothyronine Take 2 tablets by mouth  daily.   Klor-Con 20 MEQ packet Generic drug: potassium chloride Take 20 mEq by mouth daily.   melatonin 3 MG Tabs tablet Take 3 mg by mouth at bedtime.   Minivelle 0.075 MG/24HR Generic drug: estradiol Place 1 patch onto the skin 2 (two) times a week.   OVER THE COUNTER MEDICATION Magnesium Glycinate 150mg   2 pills daily at bedtime   progesterone 100 MG capsule Commonly known as: PROMETRIUM Take 100 mg by mouth daily.   PROGESTERONE MICRONIZED PO Take 100 mg by mouth daily. Compounded pill What changed: additional instructions   Tirosint 75 MCG Caps Generic drug: Levothyroxine Sodium Take 1 capsule by mouth every morning.   triamterene-hydrochlorothiazide 37.5-25 MG capsule Commonly known as: DYAZIDE Take 1 capsule by mouth daily.   VITAMIN D3 PO Take 2,000 Int'l Units/L by mouth daily.        Allergies:  Allergies  Allergen Reactions   Iodine Other (See Comments)    Pt has Hashimoto's and iodine supplements are contraindicated.  Iodinated contrast if fine, per Dr. Jennefer. 01/27/24   Nickel Rash and Other (See Comments)   Sulfa Antibiotics Hives, Itching and Other (See Comments)   Vueway  [Gadopiclenol ] Hives    Patient noted to have 3 hives on back, inner thigh and head approximately 5 minutes after injection of Vueway  Previously was given Multihance  and tolerated it well. 01/18/24  took Bendaryl 50mg  PO one hour before today's scan and tolerated well.    Past Medical History, Surgical history, Social history, and Family History were reviewed and updated.  Review of Systems: All other 10 point review of systems is negative.   Physical Exam:  vitals were not taken for this visit.   Wt Readings from Last 3 Encounters:  02/28/24 126 lb (57.2 kg)  01/24/24 125 lb 8 oz (56.9 kg)  12/06/23 125 lb 6.4 oz (56.9 kg)    Ocular: Sclerae unicteric, pupils equal, round and reactive to light Ear-nose-throat: Oropharynx clear, dentition fair Lymphatic: No cervical  or supraclavicular adenopathy Lungs no rales or rhonchi, good excursion bilaterally Heart regular rate and rhythm, no murmur appreciated Abd soft, nontender, positive bowel sounds MSK no focal spinal tenderness, no joint edema Neuro: non-focal, well-oriented, appropriate affect Breasts: Deferred   Lab Results  Component Value Date   WBC 4.6 01/17/2024   HGB 13.2 01/17/2024   HCT 40.8 01/17/2024   MCV 92.5 01/17/2024   PLT 206 01/17/2024   Lab Results  Component Value Date   FERRITIN 494 (H) 01/17/2024   IRON 132 01/17/2024   TIBC 314 01/17/2024   UIBC 182 01/17/2024   IRONPCTSAT 42 (H) 01/17/2024   Lab Results  Component Value Date   RETICCTPCT 1.6 01/17/2024   RBC 4.41 01/17/2024   No results found for: JONATHAN BONG [facility] Lab Results  Component Value Date   IGGSERUM 803 02/12/2022   IGA 253 02/12/2022   IGMSERUM 129 02/12/2022   No results found for: STEPHANY CARLOTA BENSON MARKEL EARLA JOANNIE DOC VICK, SPEI   Chemistry      Component Value Date/Time   NA 138 05/09/2023 0942   K 4.5 05/09/2023 0942   CL 103 05/09/2023 0942   CO2 30 05/09/2023 0942   BUN 21 (H) 05/09/2023 0942   CREATININE 0.63 05/09/2023 0942      Component Value Date/Time   CALCIUM 9.0 05/09/2023 0942   CALCIUM 9.0 01/06/2023 0901   ALKPHOS 72 05/09/2023 0942   AST 17 05/09/2023 0942   ALT 24 05/09/2023 0942   BILITOT 0.5 05/09/2023 0942       Impression and Plan: Ann Calhoun is a very nice 58 yo caucasian female with history of IDA. Iron studies are pending. We will replace if needed.  Follow-up in 6 months.    Lauraine Pepper, NP 8/12/202510:02 AM

## 2024-04-24 ENCOUNTER — Other Ambulatory Visit: Payer: Self-pay | Admitting: Internal Medicine

## 2024-04-24 DIAGNOSIS — E041 Nontoxic single thyroid nodule: Secondary | ICD-10-CM

## 2024-04-24 DIAGNOSIS — E063 Autoimmune thyroiditis: Secondary | ICD-10-CM

## 2024-05-11 ENCOUNTER — Inpatient Hospital Stay
Admission: RE | Admit: 2024-05-11 | Discharge: 2024-05-11 | Discharge status: Home / Self Care | Source: Ambulatory Visit | Attending: Internal Medicine | Admitting: Internal Medicine

## 2024-05-11 DIAGNOSIS — E063 Autoimmune thyroiditis: Secondary | ICD-10-CM

## 2024-05-11 DIAGNOSIS — E041 Nontoxic single thyroid nodule: Secondary | ICD-10-CM

## 2024-08-01 ENCOUNTER — Other Ambulatory Visit

## 2024-08-06 ENCOUNTER — Ambulatory Visit: Admitting: Family

## 2024-09-18 ENCOUNTER — Ambulatory Visit
Admission: RE | Admit: 2024-09-18 | Discharge: 2024-09-18 | Disposition: A | Source: Ambulatory Visit | Attending: Obstetrics and Gynecology | Admitting: Obstetrics and Gynecology

## 2024-09-18 DIAGNOSIS — N6320 Unspecified lump in the left breast, unspecified quadrant: Secondary | ICD-10-CM

## 2024-10-03 ENCOUNTER — Inpatient Hospital Stay

## 2024-10-10 ENCOUNTER — Ambulatory Visit: Admitting: Medical Oncology

## 2024-10-10 ENCOUNTER — Inpatient Hospital Stay: Payer: Self-pay

## 2024-10-10 ENCOUNTER — Inpatient Hospital Stay

## 2024-10-17 ENCOUNTER — Inpatient Hospital Stay: Payer: Self-pay | Admitting: Medical Oncology
